# Patient Record
Sex: Female | Born: 1945 | Hispanic: Refuse to answer | Marital: Married
Health system: Midwestern US, Community
[De-identification: ages and names within clinical notes are randomized; demographics above are authoritative.]

## PROBLEM LIST (undated history)

## (undated) DIAGNOSIS — M48061 Spinal stenosis, lumbar region without neurogenic claudication: Secondary | ICD-10-CM

## (undated) DIAGNOSIS — I10 Essential (primary) hypertension: Secondary | ICD-10-CM

## (undated) DIAGNOSIS — M4316 Spondylolisthesis, lumbar region: Secondary | ICD-10-CM

## (undated) DIAGNOSIS — Z780 Asymptomatic menopausal state: Secondary | ICD-10-CM

## (undated) DIAGNOSIS — M48062 Spinal stenosis, lumbar region with neurogenic claudication: Secondary | ICD-10-CM

## (undated) DIAGNOSIS — Z1239 Encounter for other screening for malignant neoplasm of breast: Secondary | ICD-10-CM

## (undated) DIAGNOSIS — Z1231 Encounter for screening mammogram for malignant neoplasm of breast: Secondary | ICD-10-CM

## (undated) DIAGNOSIS — M25519 Pain in unspecified shoulder: Secondary | ICD-10-CM

## (undated) DIAGNOSIS — E782 Mixed hyperlipidemia: Secondary | ICD-10-CM

---

## 2013-12-01 ENCOUNTER — Encounter

## 2013-12-20 MED ORDER — SODIUM BICARBONATE 4 % IV
4 % | Freq: Once | INTRAVENOUS | Status: AC
Start: 2013-12-20 — End: 2013-12-20
  Administered 2013-12-20: 12:00:00 via SUBCUTANEOUS

## 2013-12-20 MED ORDER — METHYLPREDNISOLONE 40 MG/ML SUSP FOR INJECTION
40 mg/mL | Freq: Once | INTRAMUSCULAR | Status: AC
Start: 2013-12-20 — End: 2013-12-20
  Administered 2013-12-20: 12:00:00 via INTRAMUSCULAR

## 2013-12-20 MED ORDER — BUPIVACAINE (PF) 0.5 % (5 MG/ML) IJ SOLN
0.5 % (5 mg/mL) | Freq: Once | INTRAMUSCULAR | Status: DC
Start: 2013-12-20 — End: 2013-12-20

## 2013-12-20 MED ORDER — IOHEXOL 180 MG/ML SOLN
180 mg iodine/mL | Freq: Once | INTRATHECAL | Status: AC
Start: 2013-12-20 — End: 2013-12-20
  Administered 2013-12-20: 12:00:00 via INTRATHECAL

## 2013-12-20 MED ORDER — LIDOCAINE (PF) 10 MG/ML (1 %) IJ SOLN
10 mg/mL (1 %) | Freq: Once | INTRAMUSCULAR | Status: AC
Start: 2013-12-20 — End: 2013-12-20
  Administered 2013-12-20: 12:00:00 via SUBCUTANEOUS

## 2013-12-20 MED ADMIN — sodium chloride 0.9 % injection: @ 12:00:00 | NDC 00409488810

## 2013-12-20 MED FILL — SENSORCAINE-MPF 0.5 % (5 MG/ML) INJECTION SOLUTION: 0.5 % (5 mg/mL) | INTRAMUSCULAR | Qty: 30

## 2013-12-20 MED FILL — DEPO-MEDROL 40 MG/ML SUSPENSION FOR INJECTION: 40 mg/mL | INTRAMUSCULAR | Qty: 2

## 2013-12-20 MED FILL — SODIUM CHLORIDE 0.9 % INJECTION: INTRAMUSCULAR | Qty: 10

## 2013-12-20 MED FILL — OMNIPAQUE 180 MG IODINE/ML INTRATHECAL SOLUTION: 180 mg iodine/mL | INTRATHECAL | Qty: 20

## 2013-12-20 MED FILL — XYLOCAINE-MPF 10 MG/ML (1 %) INJECTION SOLUTION: 10 mg/mL (1 %) | INTRAMUSCULAR | Qty: 10

## 2013-12-20 MED FILL — NEUT 4 % INTRAVENOUS SOLUTION: 4 % | INTRAVENOUS | Qty: 1

## 2013-12-20 NOTE — Other (Signed)
Discharged via wheel chair to husband.  No complaints voiced.

## 2014-03-02 ENCOUNTER — Encounter

## 2014-03-08 MED ORDER — LIDOCAINE (PF) 10 MG/ML (1 %) IJ SOLN
10 mg/mL (1 %) | Freq: Once | INTRAMUSCULAR | Status: AC
Start: 2014-03-08 — End: 2014-03-08
  Administered 2014-03-08: 13:00:00 via SUBCUTANEOUS

## 2014-03-08 MED ORDER — SODIUM CHLORIDE 0.9 % INJECTION
Freq: Once | INTRAMUSCULAR | Status: AC
Start: 2014-03-08 — End: 2014-03-08
  Administered 2014-03-08: 13:00:00

## 2014-03-08 MED ORDER — SODIUM BICARBONATE 4 % IV
4 % | Freq: Once | INTRAVENOUS | Status: AC
Start: 2014-03-08 — End: 2014-03-08
  Administered 2014-03-08: 13:00:00 via SUBCUTANEOUS

## 2014-03-08 MED ORDER — METHYLPREDNISOLONE 40 MG/ML SUSP FOR INJECTION
40 mg/mL | Freq: Once | INTRAMUSCULAR | Status: AC
Start: 2014-03-08 — End: 2014-03-08
  Administered 2014-03-08: 13:00:00 via INTRAMUSCULAR

## 2014-03-08 MED ORDER — IOHEXOL 180 MG/ML SOLN
180 mg iodine/mL | Freq: Once | INTRATHECAL | Status: AC
Start: 2014-03-08 — End: 2014-03-08
  Administered 2014-03-08: 13:00:00 via INTRATHECAL

## 2014-03-08 MED FILL — XYLOCAINE-MPF 10 MG/ML (1 %) INJECTION SOLUTION: 10 mg/mL (1 %) | INTRAMUSCULAR | Qty: 10

## 2014-03-08 MED FILL — OMNIPAQUE 180 MG IODINE/ML INTRATHECAL SOLUTION: 180 mg iodine/mL | INTRATHECAL | Qty: 20

## 2014-03-08 MED FILL — SODIUM CHLORIDE 0.9 % INJECTION: INTRAMUSCULAR | Qty: 10

## 2014-03-08 MED FILL — DEPO-MEDROL 40 MG/ML SUSPENSION FOR INJECTION: 40 mg/mL | INTRAMUSCULAR | Qty: 2

## 2014-03-08 MED FILL — NEUT 4 % INTRAVENOUS SOLUTION: 4 % | INTRAVENOUS | Qty: 1

## 2014-03-08 NOTE — Other (Signed)
Disc harge instructions reviewed with patient,all questions answered. Copy to patient. To discharge area via wheelchair,home with husband.

## 2014-09-14 ENCOUNTER — Encounter

## 2014-09-16 ENCOUNTER — Inpatient Hospital Stay: Admit: 2014-09-16 | Discharge: 2014-09-16 | Payer: MEDICARE | Primary: Internal Medicine

## 2014-09-16 DIAGNOSIS — M8580 Other specified disorders of bone density and structure, unspecified site: Secondary | ICD-10-CM

## 2014-09-16 DIAGNOSIS — Z1231 Encounter for screening mammogram for malignant neoplasm of breast: Secondary | ICD-10-CM

## 2015-08-22 ENCOUNTER — Encounter

## 2015-09-19 ENCOUNTER — Inpatient Hospital Stay: Admit: 2015-09-19 | Discharge: 2015-09-19 | Payer: MEDICARE | Primary: Internal Medicine

## 2015-09-19 DIAGNOSIS — Z1231 Encounter for screening mammogram for malignant neoplasm of breast: Secondary | ICD-10-CM

## 2016-08-22 ENCOUNTER — Encounter

## 2016-08-29 ENCOUNTER — Ambulatory Visit: Payer: MEDICARE | Primary: Internal Medicine

## 2016-09-03 ENCOUNTER — Encounter

## 2016-09-04 ENCOUNTER — Inpatient Hospital Stay: Admit: 2016-09-04 | Discharge: 2016-09-04 | Payer: MEDICARE | Primary: Internal Medicine

## 2016-09-04 ENCOUNTER — Ambulatory Visit

## 2016-09-04 DIAGNOSIS — M48061 Spinal stenosis, lumbar region without neurogenic claudication: Secondary | ICD-10-CM

## 2016-09-20 ENCOUNTER — Inpatient Hospital Stay: Admit: 2016-09-20 | Discharge: 2016-09-20 | Payer: MEDICARE | Primary: Internal Medicine

## 2016-09-20 DIAGNOSIS — Z1239 Encounter for other screening for malignant neoplasm of breast: Secondary | ICD-10-CM

## 2016-10-02 ENCOUNTER — Encounter

## 2016-11-06 ENCOUNTER — Inpatient Hospital Stay: Admit: 2016-11-06 | Payer: MEDICARE | Attending: Neuroradiology | Primary: Internal Medicine

## 2016-11-06 DIAGNOSIS — M48061 Spinal stenosis, lumbar region without neurogenic claudication: Secondary | ICD-10-CM

## 2016-11-06 MED ORDER — IOHEXOL 180 MG/ML SOLN
180 mg iodine/mL | Freq: Once | INTRATHECAL | Status: AC
Start: 2016-11-06 — End: 2016-11-06
  Administered 2016-11-06: 14:00:00 via INTRATHECAL

## 2016-11-06 MED ORDER — LIDOCAINE (PF) 10 MG/ML (1 %) IJ SOLN
10 mg/mL (1 %) | Freq: Once | INTRAMUSCULAR | Status: AC
Start: 2016-11-06 — End: 2016-11-06
  Administered 2016-11-06: 14:00:00 via INTRAVENOUS

## 2016-11-06 MED ORDER — METHYLPREDNISOLONE 40 MG/ML SUSP FOR INJECTION
40 mg/mL | Freq: Once | INTRAMUSCULAR | Status: AC
Start: 2016-11-06 — End: 2016-11-06
  Administered 2016-11-06: 14:00:00 via INTRA_ARTICULAR

## 2016-11-06 MED FILL — OMNIPAQUE 180 MG IODINE/ML INTRATHECAL SOLUTION: 180 mg iodine/mL | INTRATHECAL | Qty: 20

## 2016-11-06 MED FILL — XYLOCAINE-MPF 10 MG/ML (1 %) INJECTION SOLUTION: 10 mg/mL (1 %) | INTRAMUSCULAR | Qty: 10

## 2016-11-06 MED FILL — DEPO-MEDROL 40 MG/ML SUSPENSION FOR INJECTION: 40 mg/mL | INTRAMUSCULAR | Qty: 2

## 2017-01-15 ENCOUNTER — Inpatient Hospital Stay: Admit: 2017-01-15 | Payer: MEDICARE | Primary: Internal Medicine

## 2017-01-15 ENCOUNTER — Encounter

## 2017-01-15 DIAGNOSIS — M25512 Pain in left shoulder: Secondary | ICD-10-CM

## 2017-03-04 ENCOUNTER — Encounter

## 2017-03-07 ENCOUNTER — Inpatient Hospital Stay: Admit: 2017-03-07 | Discharge: 2017-03-07 | Payer: MEDICARE | Primary: Internal Medicine

## 2017-03-07 DIAGNOSIS — Z780 Asymptomatic menopausal state: Secondary | ICD-10-CM

## 2017-04-17 ENCOUNTER — Encounter

## 2017-04-28 ENCOUNTER — Inpatient Hospital Stay
Admit: 2017-04-28 | Payer: MEDICARE | Attending: Student in an Organized Health Care Education/Training Program | Primary: Internal Medicine

## 2017-04-28 DIAGNOSIS — M48061 Spinal stenosis, lumbar region without neurogenic claudication: Secondary | ICD-10-CM

## 2017-04-28 MED ORDER — IOHEXOL 180 MG/ML SOLN
180 mg iodine/mL | Freq: Once | INTRATHECAL | Status: AC
Start: 2017-04-28 — End: 2017-04-28
  Administered 2017-04-28: 18:00:00 via INTRATHECAL

## 2017-04-28 MED ORDER — SODIUM CHLORIDE 0.9 % INJECTION
Freq: Once | INTRAMUSCULAR | Status: DC
Start: 2017-04-28 — End: 2017-04-28

## 2017-04-28 MED ORDER — LIDOCAINE (PF) 10 MG/ML (1 %) IJ SOLN
10 mg/mL (1 %) | Freq: Once | INTRAMUSCULAR | Status: AC
Start: 2017-04-28 — End: 2017-04-28
  Administered 2017-04-28: 18:00:00 via SUBCUTANEOUS

## 2017-04-28 MED ORDER — METHYLPREDNISOLONE 80 MG/ML SUSP FOR INJECTION
80 mg/mL | Freq: Once | INTRAMUSCULAR | Status: AC
Start: 2017-04-28 — End: 2017-04-28
  Administered 2017-04-28: 18:00:00 via INTRAMUSCULAR

## 2017-04-28 MED FILL — DEPO-MEDROL 80 MG/ML SUSPENSION FOR INJECTION: 80 mg/mL | INTRAMUSCULAR | Qty: 1

## 2017-04-28 MED FILL — OMNIPAQUE 180 MG IODINE/ML INTRATHECAL SOLUTION: 180 mg iodine/mL | INTRATHECAL | Qty: 20

## 2017-04-28 MED FILL — XYLOCAINE-MPF 10 MG/ML (1 %) INJECTION SOLUTION: 10 mg/mL (1 %) | INTRAMUSCULAR | Qty: 10

## 2017-04-28 MED FILL — SODIUM CHLORIDE 0.9 % INJECTION: INTRAMUSCULAR | Qty: 10

## 2017-04-28 NOTE — H&P (Signed)
Radiology History and Physical    Patient: Tracy Clements 71 y.o. female       Chief Complaint: No chief complaint on file.      History of Present Illness: Back pain radiating down the right leg.    History:    Past Medical History:   Diagnosis Date   ??? Arthritis     osteroarthritis   ??? Cancer (HCC)     ? skin cancer   ??? Chronic pain     low back pain   ??? GERD (gastroesophageal reflux disease)    ??? Hyperlipidemia    ??? Hypertension    ??? Menopause 2004     Family History   Problem Relation Age of Onset   ??? Heart Disease Mother    ??? Hypertension Father    ??? Heart Disease Father    ??? Stroke Father      Social History     Socioeconomic History   ??? Marital status: MARRIED     Spouse name: Not on file   ??? Number of children: Not on file   ??? Years of education: Not on file   ??? Highest education level: Not on file   Social Needs   ??? Financial resource strain: Not on file   ??? Food insecurity - worry: Not on file   ??? Food insecurity - inability: Not on file   ??? Transportation needs - medical: Not on file   ??? Transportation needs - non-medical: Not on file   Occupational History   ??? Not on file   Tobacco Use   ??? Smoking status: Never Smoker   ??? Smokeless tobacco: Never Used   Substance and Sexual Activity   ??? Alcohol use: Yes     Comment: very rarely   ??? Drug use: Not on file   ??? Sexual activity: Not on file   Other Topics Concern   ??? Not on file   Social History Narrative   ??? Not on file       Allergies:   Allergies   Allergen Reactions   ??? Betadine [Povidone-Iodine] Atopic Dermatitis     Whelps and rash   ??? Dexamethasone Other (comments)     Extreme muscle spasm down right leg-taken orally -dose pack   ??? Erythromycin Palpitations   ??? Hydrocodone Other (comments)     "Stomach and bowel problems."   ??? Mold Extracts Sneezing   ??? Other Plant, Animal, Environmental Contact Dermatitis     Walnut trees causes itching.   ??? Sulindac Swelling     Itchy hands and swelling   ??? Tape [Adhesive] Contact Dermatitis     Whelps and rash        Current Medications:  Current Facility-Administered Medications   Medication Dose Route Frequency   ??? sodium chloride 0.9% injection 10 mL  10 mL InterCATHeter ONCE        Physical Exam:  Blood pressure (!) 174/94, pulse 70, temperature 97.8 ??F (36.6 ??C), resp. rate 20, SpO2 93 %.  GENERAL: alert, cooperative, no distress, appears stated age  LUNG: clear to auscultation bilaterally  HEART: regular rate and rhythm  ABD: Non tender, non distended.      Alerts:      Laboratory:    No results for input(s): HGB, HCT, WBC, PLT, INR, BUN, CREA, K, CRCLT, HGBEXT, HCTEXT, PLTEXT in the last 72 hours.    No lab exists for component: PTT, PT, INREXT      Plan  of Care/Planned Procedure:  Risks, benefits, and alternatives reviewed with patient and she agrees to proceed with the procedure.       Aaron EdelmanJawad S Keishawn Darsey, MD

## 2017-07-30 ENCOUNTER — Encounter

## 2017-08-07 ENCOUNTER — Inpatient Hospital Stay: Admit: 2017-08-07 | Payer: MEDICARE | Primary: Internal Medicine

## 2017-08-07 ENCOUNTER — Ambulatory Visit

## 2017-08-07 DIAGNOSIS — M48061 Spinal stenosis, lumbar region without neurogenic claudication: Secondary | ICD-10-CM

## 2017-08-27 ENCOUNTER — Encounter

## 2017-09-01 ENCOUNTER — Encounter

## 2017-09-04 ENCOUNTER — Encounter

## 2017-09-09 ENCOUNTER — Encounter

## 2017-09-22 ENCOUNTER — Inpatient Hospital Stay: Admit: 2017-09-22 | Payer: MEDICARE | Primary: Internal Medicine

## 2017-09-22 DIAGNOSIS — Z1231 Encounter for screening mammogram for malignant neoplasm of breast: Secondary | ICD-10-CM

## 2017-09-29 ENCOUNTER — Inpatient Hospital Stay
Admit: 2017-09-29 | Payer: MEDICARE | Attending: Student in an Organized Health Care Education/Training Program | Primary: Internal Medicine

## 2017-09-29 DIAGNOSIS — M48062 Spinal stenosis, lumbar region with neurogenic claudication: Secondary | ICD-10-CM

## 2017-09-29 MED ORDER — DEXAMETHASONE SODIUM PHOSPHATE (PF) 10 MG/ML INJECTION
10 mg/mL | Freq: Once | INTRAMUSCULAR | Status: AC
Start: 2017-09-29 — End: 2017-09-29
  Administered 2017-09-29: 12:00:00 via INTRAVENOUS

## 2017-09-29 MED ORDER — IOHEXOL 180 MG/ML SOLN
180 mg iodine/mL | Freq: Once | INTRATHECAL | Status: AC
Start: 2017-09-29 — End: 2017-09-29
  Administered 2017-09-29: 12:00:00 via INTRATHECAL

## 2017-09-29 MED ORDER — LIDOCAINE (PF) 10 MG/ML (1 %) IJ SOLN
10 mg/mL (1 %) | Freq: Once | INTRAMUSCULAR | Status: AC
Start: 2017-09-29 — End: 2017-09-29
  Administered 2017-09-29: 12:00:00 via SUBCUTANEOUS

## 2017-09-29 MED FILL — DEXAMETHASONE SODIUM PHOSPHATE (PF) 10 MG/ML INJECTION: 10 mg/mL | INTRAMUSCULAR | Qty: 1

## 2017-09-29 MED FILL — LIDOCAINE (PF) 10 MG/ML (1 %) IJ SOLN: 10 mg/mL (1 %) | INTRAMUSCULAR | Qty: 10

## 2017-09-29 MED FILL — OMNIPAQUE 180 MG IODINE/ML INTRATHECAL SOLUTION: 180 mg iodine/mL | INTRATHECAL | Qty: 20

## 2017-09-29 NOTE — H&P (Signed)
Radiology History and Physical    Patient: Tracy Clements 72 y.o. female       Chief Complaint: No chief complaint on file.      History of Present Illness: Back pain    History:    Past Medical History:   Diagnosis Date   ??? Arthritis     osteroarthritis   ??? Cancer (HCC)     ? skin cancer   ??? Chronic pain     low back pain   ??? GERD (gastroesophageal reflux disease)    ??? Hyperlipidemia    ??? Hypertension    ??? Menopause 2004     Family History   Problem Relation Age of Onset   ??? Heart Disease Mother    ??? Hypertension Father    ??? Heart Disease Father    ??? Stroke Father      Social History     Socioeconomic History   ??? Marital status: MARRIED     Spouse name: Not on file   ??? Number of children: Not on file   ??? Years of education: Not on file   ??? Highest education level: Not on file   Occupational History   ??? Not on file   Social Needs   ??? Financial resource strain: Not on file   ??? Food insecurity:     Worry: Not on file     Inability: Not on file   ??? Transportation needs:     Medical: Not on file     Non-medical: Not on file   Tobacco Use   ??? Smoking status: Never Smoker   ??? Smokeless tobacco: Never Used   Substance and Sexual Activity   ??? Alcohol use: Yes     Comment: very rarely   ??? Drug use: Not on file   ??? Sexual activity: Not on file   Lifestyle   ??? Physical activity:     Days per week: Not on file     Minutes per session: Not on file   ??? Stress: Not on file   Relationships   ??? Social connections:     Talks on phone: Not on file     Gets together: Not on file     Attends religious service: Not on file     Active member of club or organization: Not on file     Attends meetings of clubs or organizations: Not on file     Relationship status: Not on file   ??? Intimate partner violence:     Fear of current or ex partner: Not on file     Emotionally abused: Not on file     Physically abused: Not on file     Forced sexual activity: Not on file   Other Topics Concern   ??? Not on file   Social History Narrative    ??? Not on file       Allergies:   Allergies   Allergen Reactions   ??? Betadine [Povidone-Iodine] Atopic Dermatitis     Whelps and rash   ??? Dexamethasone Other (comments)     Extreme muscle spasm down right leg-taken orally -dose pack   ??? Erythromycin Palpitations   ??? Hydrocodone Other (comments)     "Stomach and bowel problems."   ??? Mold Extracts Sneezing   ??? Other Plant, Animal, Environmental Contact Dermatitis     Walnut trees causes itching.   ??? Sulindac Swelling     Itchy hands and swelling   ???  Tape [Adhesive] Contact Dermatitis     Whelps and rash       Current Medications:  No current facility-administered medications for this encounter.         Physical Exam:  Blood pressure 161/72, pulse 63, temperature 98.3 ??F (36.8 ??C), resp. rate 20, weight 68.9 kg (152 lb), SpO2 99 %, not currently breastfeeding.  GENERAL: alert, cooperative, no distress, appears stated age  LUNG: clear to auscultation bilaterally  HEART: regular rate and rhythm  ABD: Non tender, non distended.      Alerts:      Laboratory:    No results for input(s): HGB, HCT, WBC, PLT, INR, BUN, CREA, K, CRCLT, HGBEXT, HCTEXT, PLTEXT in the last 72 hours.    No lab exists for component: PTT, PT, INREXT      Plan of Care/Planned Procedure:  Risks, benefits, and alternatives reviewed with patient and she agrees to proceed with the procedure.       Aaron EdelmanJawad S Jaliah Foody, MD

## 2017-09-29 NOTE — Progress Notes (Signed)
Pt discharged via wheelchair escorted by husband, discharge instructions given and both verbalize understanding

## 2017-12-10 ENCOUNTER — Ambulatory Visit: Admit: 2017-12-10 | Discharge: 2017-12-10 | Payer: MEDICARE | Attending: Internal Medicine | Primary: Internal Medicine

## 2017-12-10 DIAGNOSIS — I10 Essential (primary) hypertension: Secondary | ICD-10-CM

## 2017-12-10 MED ORDER — LOSARTAN-HYDROCHLOROTHIAZIDE 50 MG-12.5 MG TAB
ORAL_TABLET | Freq: Every day | ORAL | 0 refills | Status: DC
Start: 2017-12-10 — End: 2017-12-29

## 2017-12-10 NOTE — Progress Notes (Signed)
 Visit Vitals  BP (!) 171/106 (BP 1 Location: Left arm, BP Patient Position: Sitting)   Pulse 63   Temp 98.2 F (36.8 C) (Oral)   Resp 16   Ht 5' 3 (1.6 m)   Wt 155 lb 6.4 oz (70.5 kg)   SpO2 100%   BMI 27.53 kg/m           Chief Complaint   Patient presents with   . Establish Care     Patient needs to establish PCP                 Patient is scheduled for [facility] in July.           ====Honoring Choices Invitation====    Patient was invited to [employer] [facility]  on this date and given the information folder for review.    Recommended appointment with Honoring Choices facilitator for ACP conversation regarding advance directives.    [x]  Yes  []  No  Referral sent to [facility] Choices team member or Coordinator for follow-up    []  Yes  [x]  No  Patient scheduled an appointment.       Site of Referral: [facility]              1. Have you been to the ER, urgent care clinic since your last visit?  Hospitalized since your last visit? Denies     2. Have you seen or consulted any other health care providers outside of the [facility] since your last visit?  Include any pap smears or colon screening. Patient is new to Practice.

## 2017-12-10 NOTE — Progress Notes (Signed)
Written by San Jetty, [place], as dictated by Dr. Vernia Buff, MD.    HISTORY OF PRESENT ILLNESS  Tracy Clements is a 72 y.o. female.  HPI  The patient comes in today to establish care. She brought her recent MRI and bone density results as well as her medication history.    BP is high today at 171/106, 146/90 on repeat. Patient has been check her BP at home and notes that it has averaged mid 140s systolic and mid 16X diastolic for years. Denies headaches and leg swelling. Compliant on HCTZ 25 mg, but has not noticed improved in her readings since her dosage was increased from 12.5 mg. She has not tried any other HTN medications. About 1-1.5 years ago she had a stress test at her request because she was having abnormal SOB. Stress test was unremarkable.    She has lumbar stenosis and herniated discs at L3, L4, L5, and S1 for which she sees Dr. Heather Roberts (neurosurgery). Pt has been receiving injections for 15-20 years, but notes that the injections started to not work as well last summer. Her pain radiates down her legs an into her feet. She has been seeing PT, noting she works with a PT who is very familiar with her pain. Patient has been careful with her activities and is considering switching to Dr. Sharol Harness (orthopedic surgery).    Dexa in 03/2017 found: This patient is osteopenic using the World Health Organization criteria. As compared to the prior study, there has been a significant 6.1% increase in the lumbar spine. 10 year probability of major osteoporotic fracture:  11%. 10 year probability of hip fracture:  2.1%. She is taking OTC calcium as well as vitamin D3 and K2. The pt speed walks 3 miles 2-3 times per day and aims for 9,000-10,000 steps daily.    Compliant on simvastatin 20 mg and fish oil daily. Denies side effects from simvastatin. She is not fasting for labs today.    OTC probiotics relieve her constipation. She has FHX of chronic constipation.    She takes Nexium as needed for  heartburn, noting she never took it regularly.    Pt takes mobic as needed.    She does not know if she as been checked for hepatitis C.    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    Current Outpatient Medications on File Prior to Visit   Medication Sig Dispense Refill   ??? multivitamin (ONE A DAY) tablet Take 1 Tab by mouth daily.     ??? fish oil-omega-3 fatty acids (FISH OIL) 340-1,000 mg capsule Take 1 Cap by mouth daily.     ??? co-enzyme Q-10 (CO Q-10) 100 mg capsule Take 100 mg by mouth daily.     ??? pediatric multivitamin no.42 (CHILD'S GUMMY VITAMIN-MINERAL PO) Take  by mouth.     ??? Bacillus coagulans (DIGESTIVE ADVANTAGE PO) Take  by mouth.     ??? potassium chloride SA (MICRO-K) 10 mEq capsule Take 10 mEq by mouth daily.     ??? simvastatin (ZOCOR) 20 mg tablet Take 20 mg by mouth nightly.     ??? meloxicam (MOBIC) 15 mg tablet Take 15 mg by mouth as needed for Pain.     ??? esomeprazole (NEXIUM) 40 mg capsule Take 40 mg by mouth daily.       No current facility-administered medications on file prior to visit.        Allergies   Allergen Reactions   ???  Betadine [Povidone-Iodine] Atopic Dermatitis     Whelps and rash   ??? Dexamethasone Other (comments)     Extreme muscle spasm down right leg-taken orally -dose pack   ??? Erythromycin Palpitations   ??? Hydrocodone Other (comments)     "Stomach and bowel problems."   ??? Mold Extracts Sneezing   ??? Other Plant, Animal, Environmental Contact Dermatitis     Walnut trees causes itching.   ??? Sulindac Swelling     Itchy hands and swelling   ??? Tape [Adhesive] Contact Dermatitis     Whelps and rash       Past Medical History:   Diagnosis Date   ??? Arthritis     osteroarthritis   ??? Cancer (HCC)     ? skin cancer   ??? Chronic pain     low back pain   ??? GERD (gastroesophageal reflux disease)    ??? Hyperlipidemia    ??? Hypertension    ??? Menopause 2004       Past Surgical History:   Procedure Laterality Date   ??? BREAST SURGERY PROCEDURE UNLISTED  2007    breast reduction   ??? HX BREAST  BIOPSY Left 2000    benign   ??? HX BREAST REDUCTION Bilateral    ??? HX GYN  1980    left ovarian cyst removed   ??? HX HEENT  07/2013    face lift   ??? HX HEENT      tonsiloectomy & adnoidectomy   ??? HX ORTHOPAEDIC  2005    right rotator cuff repair   ??? HX OTHER SURGICAL  1987    cyst removed left arm   ??? IR INJ FORAMIN EPID LUMB ANES/STER SNGL  09/29/2017       Family History   Problem Relation Age of Onset   ??? Heart Disease Mother    ??? Heart Disease Father    ??? Stroke Father    ??? Hypertension Father        Social History     Socioeconomic History   ??? Marital status: MARRIED     Spouse name: Not on file   ??? Number of children: Not on file   ??? Years of education: Not on file   ??? Highest education level: Not on file   Occupational History   ??? Not on file   Social Needs   ??? Financial resource strain: Not on file   ??? Food insecurity:     Worry: Not on file     Inability: Not on file   ??? Transportation needs:     Medical: Not on file     Non-medical: Not on file   Tobacco Use   ??? Smoking status: Never Smoker   ??? Smokeless tobacco: Never Used   Substance and Sexual Activity   ??? Alcohol use: Yes     Comment: very rarely   ??? Drug use: Never   ??? Sexual activity: Not Currently   Lifestyle   ??? Physical activity:     Days per week: Not on file     Minutes per session: Not on file   ??? Stress: Not on file   Relationships   ??? Social connections:     Talks on phone: Not on file     Gets together: Not on file     Attends religious service: Not on file     Active member of club or organization: Not on file     Attends meetings  of clubs or organizations: Not on file     Relationship status: Not on file   ??? Intimate partner violence:     Fear of current or ex partner: Not on file     Emotionally abused: Not on file     Physically abused: Not on file     Forced sexual activity: Not on file   Other Topics Concern   ??? Not on file   Social History Narrative   ??? Not on file       Review of Systems   Constitutional: Negative for malaise/fatigue.    HENT: Negative for congestion.    Eyes: Negative for blurred vision and pain.   Respiratory: Negative for cough and shortness of breath.    Cardiovascular: Negative for chest pain, palpitations and leg swelling.   Gastrointestinal: Negative for abdominal pain and heartburn.   Genitourinary: Negative for frequency and urgency.   Musculoskeletal: Positive for back pain. Negative for joint pain and myalgias.   Neurological: Negative for dizziness, tingling, sensory change, weakness and headaches.   Psychiatric/Behavioral: Negative for depression, memory loss and substance abuse.     Visit Vitals  BP 146/90 (BP 1 Location: Left arm, BP Patient Position: Sitting) Comment: Pt. states she has white coat syndrome /Chronic pain   Pulse 63   Temp 98.2 ??F (36.8 ??C) (Oral)   Resp 16   Ht 5\' 3"  (1.6 m)   Wt 155 lb 6.4 oz (70.5 kg)   SpO2 100%   BMI 27.53 kg/m??       Physical Exam   Constitutional: She is oriented to person, place, and time. She appears well-developed and well-nourished. No distress.   HENT:   Right Ear: External ear normal.   Left Ear: External ear normal.   Eyes: Conjunctivae and EOM are normal. Right eye exhibits no discharge. Left eye exhibits no discharge.   Neck: Normal range of motion. Neck supple.   Cardiovascular: Normal rate, regular rhythm and normal heart sounds.   Pulmonary/Chest: Effort normal and breath sounds normal. She has no wheezes.   Abdominal: Soft. Bowel sounds are normal. There is no tenderness.   Lymphadenopathy:     She has no cervical adenopathy.   Neurological: She is alert and oriented to person, place, and time.   Skin: She is not diaphoretic.   Psychiatric: She has a normal mood and affect. Her behavior is normal.   Nursing note and vitals reviewed.      ASSESSMENT and PLAN    ICD-10-CM ICD-9-CM    1. Essential hypertension I10 401.9 losartan-hydroCHLOROthiazide (HYZAAR) 50-12.5 mg per tablet sent to pharmacy.      METABOLIC PANEL, COMPREHENSIVE      CBC W/O DIFF      TSH 3RD  GENERATION    Losartan-HCTZ 50-12.5 mg prescribed. Potential side effects were discussed. She should take 1 tab PO daily. Instructed patient to keep a BP log and bring it to next appointment so message me via MyChart. CMP, CBC, and TSH ordered.   2. Spinal stenosis of lumbar region with neurogenic claudication M48.062 724.03 Followed by neurosurgery.   3. Lumbar herniated disc M51.26 722.10 Followed by neurosurgery.   4. Osteopenia of left forearm M85.832 733.90 Patient is taking calcium, vitamin D3, and vitamin K2. She does regular weight resistance exercise.   5. Mixed hyperlipidemia E78.2 272.2 LIPID PANEL    Lipid panel ordered. Pt will return during lab hours to have basic fasting labs drawn.   6. Need for  hepatitis C screening test Z11.59 V73.89 HEPATITIS C AB    Hepatitis C screening ordered.        This plan was reviewed with the patient and patient agrees. All questions were answered.    This scribe documentation was reviewed by me and accurately reflects the examination and decisions made by me.    This note will not be viewable in MyChart.

## 2017-12-10 NOTE — Progress Notes (Signed)
Written by [place] Jetty, [place], as dictated by Dr. Vernia Buff, MD.    HISTORY OF PRESENT ILLNESS  Tracy Clements is a 72 y.o. female.  HPI  The patient comes in today to establish care. She brought her recent MRI and bone density results as well as her medication history.    BP is high today at 171/106, 146/90 on repeat. Patient has been check her BP at home and notes that it has averaged mid 140s systolic and mid 16X diastolic for years. Denies headaches and leg swelling. Compliant on HCTZ 25 mg, but has not noticed improved in her readings since her dosage was increased from 12.5 mg. She has not tried any other HTN medications. About 1-1.5 years ago she had a stress test at her request because she was having abnormal SOB. Stress test was unremarkable.    She has lumbar stenosis and herniated discs at L3, L4, L5, and S1 for which she sees Dr. Heather Roberts (neurosurgery). Pt has been receiving injections for 15-20 years, but notes that the injections started to not work as well last summer. Her pain radiates down her legs an into her feet. She has been seeing PT, noting she works with a PT who is very familiar with her pain. Patient has been careful with her activities and is considering switching to Dr. Sharol Harness (orthopedic surgery).    Dexa in 03/2017 found: This patient is osteopenic using the World Health Organization criteria. As compared to the prior study, there has been a significant 6.1% increase in the lumbar spine. 10 year probability of major osteoporotic fracture:  11%. 10 year probability of hip fracture:  2.1%. She is taking OTC calcium as well as vitamin D3 and K2. The pt speed walks 3 miles 2-3 times per day and aims for 9,000-10,000 steps daily.    Compliant on simvastatin 20 mg and fish oil daily. Denies side effects from simvastatin. She is not fasting for labs today.    OTC probiotics relieve her constipation. She has FHX of chronic constipation.     She takes Nexium as needed for heartburn, noting she never took it regularly.    Pt takes mobic as needed.    She does not know if she as been checked for hepatitis C.    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    Current Outpatient Medications on File Prior to Visit   Medication Sig Dispense Refill   ??? multivitamin (ONE A DAY) tablet Take 1 Tab by mouth daily.     ??? fish oil-omega-3 fatty acids (FISH OIL) 340-1,000 mg capsule Take 1 Cap by mouth daily.     ??? co-enzyme Q-10 (CO Q-10) 100 mg capsule Take 100 mg by mouth daily.     ??? pediatric multivitamin no.42 (CHILD'S GUMMY VITAMIN-MINERAL PO) Take  by mouth.     ??? Bacillus coagulans (DIGESTIVE ADVANTAGE PO) Take  by mouth.     ??? potassium chloride SA (MICRO-K) 10 mEq capsule Take 10 mEq by mouth daily.     ??? simvastatin (ZOCOR) 20 mg tablet Take 20 mg by mouth nightly.     ??? meloxicam (MOBIC) 15 mg tablet Take 15 mg by mouth as needed for Pain.     ??? esomeprazole (NEXIUM) 40 mg capsule Take 40 mg by mouth daily.       No current facility-administered medications on file prior to visit.        Allergies   Allergen Reactions   ???  Betadine [Povidone-Iodine] Atopic Dermatitis     Whelps and rash   ??? Dexamethasone Other (comments)     Extreme muscle spasm down right leg-taken orally -dose pack   ??? Erythromycin Palpitations   ??? Hydrocodone Other (comments)     "Stomach and bowel problems."   ??? Mold Extracts Sneezing   ??? Other Plant, Animal, Environmental Contact Dermatitis     Walnut trees causes itching.   ??? Sulindac Swelling     Itchy hands and swelling   ??? Tape [Adhesive] Contact Dermatitis     Whelps and rash       Past Medical History:   Diagnosis Date   ??? Arthritis     osteroarthritis   ??? Cancer (HCC)     ? skin cancer   ??? Chronic pain     low back pain   ??? GERD (gastroesophageal reflux disease)    ??? Hyperlipidemia    ??? Hypertension    ??? Menopause 2004       Past Surgical History:   Procedure Laterality Date   ??? BREAST SURGERY PROCEDURE UNLISTED  2007     breast reduction   ??? HX BREAST BIOPSY Left 2000    benign   ??? HX BREAST REDUCTION Bilateral    ??? HX GYN  1980    left ovarian cyst removed   ??? HX HEENT  07/2013    face lift   ??? HX HEENT      tonsiloectomy & adnoidectomy   ??? HX ORTHOPAEDIC  2005    right rotator cuff repair   ??? HX OTHER SURGICAL  1987    cyst removed left arm   ??? IR INJ FORAMIN EPID LUMB ANES/STER SNGL  09/29/2017       Family History   Problem Relation Age of Onset   ??? Heart Disease Mother    ??? Heart Disease Father    ??? Stroke Father    ??? Hypertension Father        Social History     Socioeconomic History   ??? Marital status: MARRIED     Spouse name: Not on file   ??? Number of children: Not on file   ??? Years of education: Not on file   ??? Highest education level: Not on file   Occupational History   ??? Not on file   Social Needs   ??? Financial resource strain: Not on file   ??? Food insecurity:     Worry: Not on file     Inability: Not on file   ??? Transportation needs:     Medical: Not on file     Non-medical: Not on file   Tobacco Use   ??? Smoking status: Never Smoker   ??? Smokeless tobacco: Never Used   Substance and Sexual Activity   ??? Alcohol use: Yes     Comment: very rarely   ??? Drug use: Never   ??? Sexual activity: Not Currently   Lifestyle   ??? Physical activity:     Days per week: Not on file     Minutes per session: Not on file   ??? Stress: Not on file   Relationships   ??? Social connections:     Talks on phone: Not on file     Gets together: Not on file     Attends religious service: Not on file     Active member of club or organization: Not on file     Attends meetings  of clubs or organizations: Not on file     Relationship status: Not on file   ??? Intimate partner violence:     Fear of current or ex partner: Not on file     Emotionally abused: Not on file     Physically abused: Not on file     Forced sexual activity: Not on file   Other Topics Concern   ??? Not on file   Social History Narrative   ??? Not on file       Review of Systems    Constitutional: Negative for malaise/fatigue.   HENT: Negative for congestion.    Eyes: Negative for blurred vision and pain.   Respiratory: Negative for cough and shortness of breath.    Cardiovascular: Negative for chest pain, palpitations and leg swelling.   Gastrointestinal: Negative for abdominal pain and heartburn.   Genitourinary: Negative for frequency and urgency.   Musculoskeletal: Positive for back pain. Negative for joint pain and myalgias.   Neurological: Negative for dizziness, tingling, sensory change, weakness and headaches.   Psychiatric/Behavioral: Negative for depression, memory loss and substance abuse.     Visit Vitals  BP 146/90 (BP 1 Location: Left arm, BP Patient Position: Sitting) Comment: Pt. states she has white coat syndrome /Chronic pain   Pulse 63   Temp 98.2 ??F (36.8 ??C) (Oral)   Resp 16   Ht 5\' 3"  (1.6 m)   Wt 155 lb 6.4 oz (70.5 kg)   SpO2 100%   BMI 27.53 kg/m??       Physical Exam   Constitutional: She is oriented to person, place, and time. She appears well-developed and well-nourished. No distress.   HENT:   Right Ear: External ear normal.   Left Ear: External ear normal.   Eyes: Conjunctivae and EOM are normal. Right eye exhibits no discharge. Left eye exhibits no discharge.   Neck: Normal range of motion. Neck supple.   Cardiovascular: Normal rate, regular rhythm and normal heart sounds.   Pulmonary/Chest: Effort normal and breath sounds normal. She has no wheezes.   Abdominal: Soft. Bowel sounds are normal. There is no tenderness.   Lymphadenopathy:     She has no cervical adenopathy.   Neurological: She is alert and oriented to person, place, and time.   Skin: She is not diaphoretic.   Psychiatric: She has a normal mood and affect. Her behavior is normal.   Nursing note and vitals reviewed.      ASSESSMENT and PLAN    ICD-10-CM ICD-9-CM    1. Essential hypertension I10 401.9 losartan-hydroCHLOROthiazide (HYZAAR) 50-12.5 mg per tablet sent to pharmacy.       METABOLIC PANEL, COMPREHENSIVE      CBC W/O DIFF      TSH 3RD GENERATION    Losartan-HCTZ 50-12.5 mg prescribed. Potential side effects were discussed. She should take 1 tab PO daily. Instructed patient to keep a BP log and bring it to next appointment so message me via MyChart. CMP, CBC, and TSH ordered.   2. Spinal stenosis of lumbar region with neurogenic claudication M48.062 724.03 Followed by neurosurgery.   3. Lumbar herniated disc M51.26 722.10 Followed by neurosurgery.   4. Osteopenia of left forearm M85.832 733.90 Patient is taking calcium, vitamin D3, and vitamin K2. She does regular weight resistance exercise.   5. Mixed hyperlipidemia E78.2 272.2 LIPID PANEL    Lipid panel ordered. Pt will return during lab hours to have basic fasting labs drawn.   6. Need for  hepatitis C screening test Z11.59 V73.89 HEPATITIS C AB    Hepatitis C screening ordered.        This plan was reviewed with the patient and patient agrees. All questions were answered.    This scribe documentation was reviewed by me and accurately reflects the examination and decisions made by me.    This note will not be viewable in MyChart.

## 2017-12-10 NOTE — Progress Notes (Signed)
Visit Vitals  BP (!) 171/106 (BP 1 Location: Left arm, BP Patient Position: Sitting)   Pulse 63   Temp 98.2 ??F (36.8 ??C) (Oral)   Resp 16   Ht 5' 3" (1.6 m)   Wt 155 lb 6.4 oz (70.5 kg)   SpO2 100%   BMI 27.53 kg/m??           Chief Complaint   Patient presents with   ??? Establish Care     Patient needs to establish PCP                 Patient is scheduled for [facility] in July.           ====Honoring Choices Invitation====    Patient was invited to Honoring Choices [place] on this date and given the information folder for review.    Recommended appointment with Honoring Choices facilitator for ACP conversation regarding advance directives.    [x] Yes  [] No  Referral sent to Honoring Choices team member or Coordinator for follow-up    [] Yes  [x] No  Patient scheduled an appointment.       Site of Referral: [facility]              1. Have you been to the ER, urgent care clinic since your last visit?  Hospitalized since your last visit? Denies     2. Have you seen or consulted any other health care providers outside of the  Health System since your last visit?  Include any pap smears or colon screening. Patient is new to Practice.

## 2017-12-11 ENCOUNTER — Encounter

## 2017-12-11 NOTE — Progress Notes (Signed)
Tracy Clements, your cholesterol came back little elevated. How is your new medication working? Will discuss about zocor dose on your next visit.

## 2017-12-11 NOTE — Progress Notes (Signed)
Tracy Clements, your cholesterol came back little elevated. How is your new medication working? Will discuss about zocor dose on your next visit.

## 2017-12-12 LAB — METABOLIC PANEL, COMPREHENSIVE
A-G Ratio: 1.9 (ref 1.2–2.2)
ALT (SGPT): 31 IU/L (ref 0–32)
AST (SGOT): 35 IU/L (ref 0–40)
Albumin: 4.3 g/dL (ref 3.5–4.8)
Alk. phosphatase: 102 IU/L (ref 39–117)
BUN/Creatinine ratio: 17 (ref 12–28)
BUN: 15 mg/dL (ref 8–27)
Bilirubin, total: 0.3 mg/dL (ref 0.0–1.2)
CO2: 27 mmol/L (ref 20–29)
Calcium: 9.3 mg/dL (ref 8.7–10.3)
Chloride: 100 mmol/L (ref 96–106)
Creatinine: 0.88 mg/dL (ref 0.57–1.00)
GFR est AA: 76 mL/min/{1.73_m2} (ref >59)
GFR est non-AA: 66 mL/min/{1.73_m2} (ref >59)
GLOBULIN, TOTAL: 2.3 g/dL (ref 1.5–4.5)
Glucose: 98 mg/dL (ref 65–99)
Potassium: 3.6 mmol/L (ref 3.5–5.2)
Protein, total: 6.6 g/dL (ref 6.0–8.5)
Sodium: 142 mmol/L (ref 134–144)

## 2017-12-12 LAB — CBC W/O DIFF
HCT: 38.4 % (ref 34.0–46.6)
HGB: 13 g/dL (ref 11.1–15.9)
MCH: 30.7 pg (ref 26.6–33.0)
MCHC: 33.9 g/dL (ref 31.5–35.7)
MCV: 91 fL (ref 79–97)
PLATELET: 245 10*3/uL (ref 150–450)
RBC: 4.23 x10E6/uL (ref 3.77–5.28)
RDW: 14.4 % (ref 12.3–15.4)
WBC: 5.3 10*3/uL (ref 3.4–10.8)

## 2017-12-12 LAB — LIPID PANEL
Cholesterol, Total: 223 mg/dL — ABNORMAL HIGH (ref 100–199)
Cholesterol, total: 223 mg/dL — ABNORMAL HIGH (ref 100–199)
HDL Cholesterol: 95 mg/dL (ref >39)
HDL: 95 mg/dL (ref >39)
LDL Calculated: 116 mg/dL — ABNORMAL HIGH (ref 0–99)
LDL, calculated: 116 mg/dL — ABNORMAL HIGH (ref 0–99)
Triglyceride: 59 mg/dL (ref 0–149)
Triglycerides: 59 mg/dL (ref 0–149)
VLDL Cholesterol Calculated: 12 mg/dL (ref 5–40)
VLDL, calculated: 12 mg/dL (ref 5–40)

## 2017-12-12 LAB — HEPATITIS C AB: Hep C Virus Ab: <0.1 s/co ratio (ref 0.0–0.9)

## 2017-12-12 LAB — TSH 3RD GENERATION
TSH: 3.53 u[IU]/mL (ref 0.450–4.500)
TSH: 3.53 u[IU]/mL (ref 0.450–4.500)

## 2017-12-12 LAB — CBC
Hematocrit: 38.4 % (ref 34.0–46.6)
Hemoglobin: 13 g/dL (ref 11.1–15.9)
MCH: 30.7 pg (ref 26.6–33.0)
MCHC: 33.9 g/dL (ref 31.5–35.7)
MCV: 91 fL (ref 79–97)
Platelets: 245 10*3/uL (ref 150–450)
RBC: 4.23 x10E6/uL (ref 3.77–5.28)
RDW: 14.4 % (ref 12.3–15.4)
WBC: 5.3 10*3/uL (ref 3.4–10.8)

## 2017-12-12 LAB — COMPREHENSIVE METABOLIC PANEL
ALT: 31 IU/L (ref 0–32)
AST: 35 IU/L (ref 0–40)
Albumin/Globulin Ratio: 1.9 NA (ref 1.2–2.2)
Albumin: 4.3 g/dL (ref 3.5–4.8)
Alkaline Phosphatase: 102 IU/L (ref 39–117)
BUN: 15 mg/dL (ref 8–27)
Bun/Cre Ratio: 17 NA (ref 12–28)
CO2: 27 mmol/L (ref 20–29)
Calcium: 9.3 mg/dL (ref 8.7–10.3)
Chloride: 100 mmol/L (ref 96–106)
Creatinine: 0.88 mg/dL (ref 0.57–1.00)
EGFR IF NonAfrican American: 66 mL/min/{1.73_m2} (ref >59)
GFR African American: 76 mL/min/{1.73_m2} (ref >59)
Globulin, Total: 2.3 g/dL (ref 1.5–4.5)
Glucose: 98 mg/dL (ref 65–99)
Potassium: 3.6 mmol/L (ref 3.5–5.2)
Sodium: 142 mmol/L (ref 134–144)
Total Bilirubin: 0.3 mg/dL (ref 0.0–1.2)
Total Protein: 6.6 g/dL (ref 6.0–8.5)

## 2017-12-12 LAB — HEPATITIS C ANTIBODY: HCV Ab: <0.1 s/co ratio (ref 0.0–0.9)

## 2017-12-29 ENCOUNTER — Ambulatory Visit: Admit: 2017-12-29 | Discharge: 2017-12-29 | Payer: MEDICARE | Attending: Internal Medicine | Primary: Internal Medicine

## 2017-12-29 ENCOUNTER — Ambulatory Visit: Attending: Internal Medicine | Primary: Internal Medicine

## 2017-12-29 DIAGNOSIS — Z0000 Encounter for general adult medical examination without abnormal findings: Secondary | ICD-10-CM

## 2017-12-29 MED ORDER — DIPHTH,PERTUS(ACEL)TETANUS VAC(PF) 2 LF-(5-3-5MCG)-5 LF/0.5 ML IM SUSP
2 Lf-(.5-5-3-5 mcg)-5Lf/0.5 mL | Freq: Once | INTRAMUSCULAR | 0 refills | Status: AC
Start: 2017-12-29 — End: 2017-12-29

## 2017-12-29 MED ORDER — LOSARTAN-HYDROCHLOROTHIAZIDE 50 MG-12.5 MG TAB
ORAL_TABLET | Freq: Every day | ORAL | 0 refills | Status: DC
Start: 2017-12-29 — End: 2018-04-02

## 2017-12-29 NOTE — Progress Notes (Signed)
Tracy Clements is a 72 y.o. female and presents for Annual Medicare Wellness Visit.    Assessment of cognitive impairment: Alert and oriented x 3    Depression Screen:   3 most recent PHQ Screens 12/10/2017   Little interest or pleasure in doing things Not at all   Feeling down, depressed, irritable, or hopeless Not at all   Total Score PHQ 2 0       Fall Risk Assessment:    Fall Risk Assessment, last 12 mths 12/10/2017   Able to walk? Yes   Fall in past 12 months? No       Abuse Screen:   Abuse Screening Questionnaire 12/10/2017   Do you ever feel afraid of your partner? N   Are you in a relationship with someone who physically or mentally threatens you? N   Is it safe for you to go home? Y       Activities of Daily Living:  Self-care.   Requires assistance with: no ADLs  Patient handle his/her own medications  yes Use of pill box  yes  Activities of Daily Living:   ADL Assessment 12/10/2017   Feeding yourself No Help Needed   Getting from bed to chair No Help Needed   Getting dressed No Help Needed   Bathing or showering No Help Needed   Walk across the room (includes cane/walker) No Help Needed   Using the telphone No Help Needed   Taking your medications No Help Needed   Preparing meals No Help Needed   Managing money (expenses/bills) No Help Needed   Moderately strenuous housework (laundry) No Help Needed   Shopping for personal items (toiletries/medicines) No Help Needed   Shopping for groceries No Help Needed   Driving No Help Needed   Climbing a flight of stairs No Help Needed   Getting to places beyond walking distances No Help Needed       Health Maintenance:  Daily Aspirin: no and recommended to start daily 81mg   Bone Density: up to date 03/2017  Glaucoma Screening: yes 08/2016  Immunizations:    Tetanus: not up to date - script given.  Influenza: declined   Shingles: not up to date - she will get it.Marland Kitchen.  PPSV-23: up to date, will get the second dose. Prevnar-13: up to date in 2018    Cancer screening:     Cervical: was told by Ob/Gyn not needed anymore  Breast: up to date.  Colon: up to date. 2 years ago   Prostate:  N/a    Alcohol Risk Screen:   On any occasion during the past 3 months, have you had more than 3 drinks(female) or 4 drinks (female) containing alcohol in one?  No  Do you average more than 7 drinks (female) or 14 drinks (female) per week?  No  Type and amount:none.    Hearing Loss:  Hearing is good. denies any hearing loss    Vision Loss:   Wears glasses, contact lenses, or have any other visual impairment  yes    Adult Nutrition Screen:  No risk factors noted.    Advance Care Planning:   End of Life Planning: has NO advanced directive  - add't info provided  Offered [facility] [place] ACP-Facilitator appointment yes      Medications/Allergies: Reviewed with patient  Prior to Admission medications    Medication Sig Start Date End Date Taking? Authorizing Provider   losartan-hydroCHLOROthiazide (HYZAAR) 50-12.5 mg per tablet Take 1 Tab by mouth daily  for 90 days. 12/29/17 03/29/18 Yes Vernia Buff, MD   multivitamin (ONE A DAY) tablet Take 1 Tab by mouth daily.   Yes Provider, Historical   fish oil-omega-3 fatty acids (FISH OIL) 340-1,000 mg capsule Take 1 Cap by mouth daily.   Yes Provider, Historical   co-enzyme Q-10 (CO Q-10) 100 mg capsule Take 100 mg by mouth daily.   Yes Provider, Historical   pediatric multivitamin no.42 (CHILD'S GUMMY VITAMIN-MINERAL PO) Take  by mouth.   Yes Provider, Historical   Bacillus coagulans (DIGESTIVE ADVANTAGE PO) Take  by mouth.   Yes Provider, Historical   potassium chloride SA (MICRO-K) 10 mEq capsule Take 10 mEq by mouth daily.   Yes Provider, Historical   simvastatin (ZOCOR) 20 mg tablet Take 20 mg by mouth nightly.   Yes Provider, Historical   meloxicam (MOBIC) 15 mg tablet Take 15 mg by mouth as needed for Pain.   Yes Provider, Historical   esomeprazole (NEXIUM) 40 mg capsule Take 40 mg by mouth daily.   Yes Provider, Historical       Allergies   Allergen  Reactions   ??? Betadine [Povidone-Iodine] Atopic Dermatitis     Whelps and rash   ??? Dexamethasone Other (comments)     Extreme muscle spasm down right leg-taken orally -dose pack   ??? Erythromycin Palpitations   ??? Hydrocodone Other (comments)     "Stomach and bowel problems."   ??? Mold Extracts Sneezing   ??? Other Plant, Animal, Environmental Contact Dermatitis     Walnut trees causes itching.   ??? Sulindac Swelling     Itchy hands and swelling   ??? Tape [Adhesive] Contact Dermatitis     Whelps and rash       Objective:  Visit Vitals  BP 137/67 (BP 1 Location: Left arm, BP Patient Position: Sitting)   Pulse 68   Temp 98.2 ??F (36.8 ??C) (Oral)   Resp 16   Ht 5\' 3"  (1.6 m)   Wt 155 lb 6.4 oz (70.5 kg)   SpO2 100%   BMI 27.53 kg/m??    Body mass index is 27.53 kg/m??.    Problem List: Reviewed with patient and discussed risk factors.    Patient Active Problem List   Diagnosis Code   ??? Mixed hyperlipidemia E78.2   ??? Essential hypertension I10   ??? Spinal stenosis of lumbosacral region M48.07       PSH: Reviewed with patient  Past Surgical History:   Procedure Laterality Date   ??? BREAST SURGERY PROCEDURE UNLISTED  2007    breast reduction   ??? HX BREAST BIOPSY Left 2000    benign   ??? HX BREAST REDUCTION Bilateral    ??? HX GYN  1980    left ovarian cyst removed   ??? HX HEENT  07/2013    face lift   ??? HX HEENT      tonsiloectomy & adnoidectomy   ??? HX ORTHOPAEDIC  2005    right rotator cuff repair   ??? HX OTHER SURGICAL  1987    cyst removed left arm   ??? IR INJ FORAMIN EPID LUMB ANES/STER SNGL  09/29/2017        SH: Reviewed with patient  Social History     Tobacco Use   ??? Smoking status: Never Smoker   ??? Smokeless tobacco: Never Used   Substance Use Topics   ??? Alcohol use: Yes     Comment: very rarely   ??? Drug use:  Never       FH: Reviewed with patient  Family History   Problem Relation Age of Onset   ??? Heart Disease Mother    ??? Heart Disease Father    ??? Stroke Father    ??? Hypertension Father        Current medical providers:    Patient  Care Team:  Vernia Buff, MD as PCP - General (Internal Medicine)    Plan:      Diagnoses and all orders for this visit:    Medicare annual wellness visit, subsequent  Immunization & health screening discussed with her.    ACP (advance care planning)  Advanced directive discussed with her . Referral placed.      Need for diphtheria-tetanus-pertussis (Tdap) vaccine  -     diph,Pertuss,Acell,,Tet Vac-PF (ADACEL) 2 Lf-(2.5-5-3-5 mcg)-5Lf/0.5 mL susp; 0.5 mL by IntraMUSCular route once for 1 dose., Print, Disp-0.5 mL, R-0                   Health Maintenance   Topic Date Due   ??? DTaP/Tdap/Td series (1 - Tdap) 08/02/1967   ??? Shingrix Vaccine Age 71> (1 of 2) 08/01/1996   ??? FOBT Q 1 YEAR AGE 35-75  08/01/1996   ??? Pneumococcal 65+ years (1 of 2 - PCV13) 08/02/2011   ??? MEDICARE YEARLY EXAM  09/17/2016   ??? Influenza Age 30 to Adult  01/29/2018   ??? GLAUCOMA SCREENING Q2Y  05/16/2019   ??? BREAST CANCER SCRN MAMMOGRAM  09/23/2019   ??? Hepatitis C Screening  Completed   ??? Bone Densitometry (Dexa) Screening  Completed          Urinary/ Fecal Incontinence: None.    Regular physical exercise: active, walks daily, 6000 -14,000 steps    Patient verbalized understanding of information presented. AVS and Medicare Part B Preventive Services Table printed and given to pt and reviewed. See table for findings under Recommendation and Scheduled. All of the patient's questions were answered.          Problem Focused Progress Note    Name: Tracy Clements    Today???s Date: 01/01/2018  Ethnicity: PATIENT REFUSED  Race: PATIENT REFUSED  MRN: [id number]  Age: 72 y.o.  DOB: 06-Jul-1945  Sex: Female       HPI:   Tracy Clements is a 72 y.o. year old female who was also seen for Blood pressure follow up. She did bring a B.p log book with her.  Most of the readings are running in normal range. No side effects from the medication.  Labs reviewed. Cholesterol still elevated. Was not much active due to the lower back problem but now has started walking again on  average 10,000 steps & will take zocor daily dose.  .    Review of Systems   Constitutional: negative for fatigue and malaise  Eyes: negative for visual disturbance and redness  Ears, nose, mouth, throat, and face: negative for nasal congestion and hoarseness  Respiratory: negative for cough or dyspnea on exertion  Cardiovascular: negative for chest pain, palpitations, irregular heart beats  Gastrointestinal: negative for reflux symptoms, change in bowel habits and abdominal pain  Musculoskeletal:negative for muscle weakness but positive for lower back pain.  Neurological: negative for headaches, dizziness and seizures  Behavioral/Psych: negative for aggressive behavior, behavior problems and depression    Physical Examination     Visit Vitals  BP 137/67 (BP 1 Location: Left arm, BP Patient Position: Sitting)   Pulse 68  Temp 98.2 ??F (36.8 ??C) (Oral)   Resp 16   Ht 5\' 3"  (1.6 m)   Wt 155 lb 6.4 oz (70.5 kg)   SpO2 100%   BMI 27.53 kg/m??     General:  Alert, cooperative, no distress, appears stated age.   Head:  Normocephalic, without obvious abnormality, atraumatic.   Eyes:  Conjunctivae/corneas clear. PERRL, EOMs intact. Fundi benign.   Ears:  Normal TMs and external ear canals both ears.   Nose: Nares normal. Septum midline. Mucosa normal. No drainage or sinus tenderness.   Throat: Lips, mucosa, and tongue normal. Teeth and gums normal.   Neck: Supple, symmetrical, trachea midline, no adenopathy, thyroid: no enlargement/tenderness/nodules, no carotid bruit and no JVD.   Back:   Symmetric, no curvature. ROM normal. No CVA tenderness.   Lungs:   Clear to auscultation bilaterally.   Chest wall:  No tenderness or deformity.   Heart:  Regular rate and Tracy, S1, S2 normal, no murmur,        Abdomen:   Soft, non-tender. Bowel sounds normal. No masses,            Extremities: Extremities normal, atraumatic, no cyanosis or edema.   Pulses: 2+ and symmetric all extremities.   Skin: Skin color, texture, turgor normal. No  rashes or lesions.   Lymph nodes: Cervical, supraclavicular, and axillary nodes normal.   Neurologic: CNII-XII intact. Normal strength, sensation and reflexes throughout.       Diagnoses and all orders for this visit:      Essential hypertension  -     losartan-hydroCHLOROthiazide (HYZAAR) 50-12.5 mg per tablet; Take 1 Tab by mouth daily for 90 days., Normal, Disp-90 Tab, R-0    Mixed hyperlipidemia  Labs reviewed. Recommended continuing zocor & trying Apple Cider Vinegar along with diet & daily walk. Repeat labs in 4 months.    Spinal stenosis of lumbosacral region  Doing well with PT , will see Dr. Lodema Hong.     Need for diphtheria-tetanus-pertussis (Tdap) vaccine  -     diph,Pertuss,Acell,,Tet Vac-PF (ADACEL) 2 Lf-(2.5-5-3-5 mcg)-5Lf/0.5 mL susp; 0.5 mL by IntraMUSCular route once for 1 dose., Print, Disp-0.5 mL, R-0      Vernia Buff, MD  01/01/2018  12:54 PM

## 2017-12-29 NOTE — Progress Notes (Signed)
 Tracy Clements is a 72 y.o. female    Chief Complaint   Patient presents with   . Hypertension   . Cholesterol Problem     1. Have you been to the ER, urgent care clinic since your last visit?  Hospitalized since your last visit? No    2. Have you seen or consulted any other health care providers outside of the [facility] since your last visit?  Include any pap smears or colon screening. No    Visit Vitals  BP (!) 177/97 (BP 1 Location: Left arm, BP Patient Position: Sitting)   Pulse 68   Temp 98.2 F (36.8 C) (Oral)   Resp 16   Ht 5' 3 (1.6 m)   Wt 155 lb 6.4 oz (70.5 kg)   SpO2 100%   BMI 27.53 kg/m

## 2017-12-29 NOTE — ACP (Advance Care Planning) (Signed)
====  Honoring Choices Invitation====    Patient was invited to Honoring Choices [place] on this date and given the information folder for review.    Recommended appointment with Honoring Choices facilitator for ACP conversation regarding advance directives.    [x] Yes  [] No  Referral sent to Honoring Choices team member or Coordinator for follow-up    [] Yes  [x] No  Patient scheduled an appointment.       Site of Referral: [facility]

## 2017-12-29 NOTE — Progress Notes (Signed)
Tracy Clements is a 72 y.o. female and presents for Annual Medicare Wellness Visit.    Assessment of cognitive impairment: Alert and oriented x 3    Depression Screen:   3 most recent PHQ Screens 12/10/2017   Little interest or pleasure in doing things Not at all   Feeling down, depressed, irritable, or hopeless Not at all   Total Score PHQ 2 0       Fall Risk Assessment:    Fall Risk Assessment, last 12 mths 12/10/2017   Able to walk? Yes   Fall in past 12 months? No       Abuse Screen:   Abuse Screening Questionnaire 12/10/2017   Do you ever feel afraid of your partner? N   Are you in a relationship with someone who physically or mentally threatens you? N   Is it safe for you to go home? Y       Activities of Daily Living:  Self-care.   Requires assistance with: no ADLs  Patient handle his/her own medications  yes Use of pill box  yes  Activities of Daily Living:   ADL Assessment 12/10/2017   Feeding yourself No Help Needed   Getting from bed to chair No Help Needed   Getting dressed No Help Needed   Bathing or showering No Help Needed   Walk across the room (includes cane/walker) No Help Needed   Using the telphone No Help Needed   Taking your medications No Help Needed   Preparing meals No Help Needed   Managing money (expenses/bills) No Help Needed   Moderately strenuous housework (laundry) No Help Needed   Shopping for personal items (toiletries/medicines) No Help Needed   Shopping for groceries No Help Needed   Driving No Help Needed   Climbing a flight of stairs No Help Needed   Getting to places beyond walking distances No Help Needed       Health Maintenance:Daily Aspirin: no and recommended to start daily 81mg   Bone Density: up to date 03/2017  Glaucoma Screening: yes 08/2016  Immunizations:    Tetanus: not up to date - script given.  Influenza: declined   Shingles: not up to date - she will get it.Marland Kitchen  PPSV-23: up to date, will get the second dose. Prevnar-13: up to date in 2018    Cancer screening:     Cervical: was told by Ob/Gyn not needed anymore  Breast: up to date.  Colon: up to date. 2 years ago   Prostate:  N/a    Alcohol Risk Screen:   On any occasion during the past 3 months, have you had more than 3 drinks(female) or 4 drinks (female) containing alcohol in one?  No  Do you average more than 7 drinks (female) or 14 drinks (female) per week?  No  Type and amount:none.    Hearing Loss:  Hearing is good. denies any hearing loss    Vision Loss:   Wears glasses, contact lenses, or have any other visual impairment  yes    Adult Nutrition Screen:  No risk factors noted.    Advance Care Planning: End of Life Planning: has NO advanced directive  - add't info provided  Offered [facility] [place] ACP-Facilitator appointment yes      Medications/Allergies: Reviewed with patient  Prior to Admission medications    Medication Sig Start Date End Date Taking? Authorizing Provider   losartan-hydroCHLOROthiazide (HYZAAR) 50-12.5 mg per tablet Take 1 Tab by mouth daily for 90 days. 12/29/17  03/29/18 Yes Vernia BuffHabib, Shakeena Kafer, MD   multivitamin (ONE A DAY) tablet Take 1 Tab by mouth daily.   Yes Provider, Historical   fish oil-omega-3 fatty acids (FISH OIL) 340-1,000 mg capsule Take 1 Cap by mouth daily.   Yes Provider, Historical   co-enzyme Q-10 (CO Q-10) 100 mg capsule Take 100 mg by mouth daily.   Yes Provider, Historical   pediatric multivitamin no.42 (CHILD'S GUMMY VITAMIN-MINERAL PO) Take  by mouth.   Yes Provider, Historical   Bacillus coagulans (DIGESTIVE ADVANTAGE PO) Take  by mouth.   Yes Provider, Historical   potassium chloride SA (MICRO-K) 10 mEq capsule Take 10 mEq by mouth daily.   Yes Provider, Historical   simvastatin (ZOCOR) 20 mg tablet Take 20 mg by mouth nightly.   Yes Provider, Historical   meloxicam (MOBIC) 15 mg tablet Take 15 mg by mouth as needed for Pain.   Yes Provider, Historical   esomeprazole (NEXIUM) 40 mg capsule Take 40 mg by mouth daily.   Yes Provider, Historical       Allergies    Allergen Reactions   ??? Betadine [Povidone-Iodine] Atopic Dermatitis     Whelps and rash   ??? Dexamethasone Other (comments)     Extreme muscle spasm down right leg-taken orally -dose pack   ??? Erythromycin Palpitations   ??? Hydrocodone Other (comments)     "Stomach and bowel problems."   ??? Mold Extracts Sneezing   ??? Other Plant, Animal, Environmental Contact Dermatitis     Walnut trees causes itching.   ??? Sulindac Swelling     Itchy hands and swelling   ??? Tape [Adhesive] Contact Dermatitis     Whelps and rash     Objective:  Visit Vitals  BP 137/67 (BP 1 Location: Left arm, BP Patient Position: Sitting)   Pulse 68   Temp 98.2 ??F (36.8 ??C) (Oral)   Resp 16   Ht 5\' 3"  (1.6 m)   Wt 155 lb 6.4 oz (70.5 kg)   SpO2 100%   BMI 27.53 kg/m??    Body mass index is 27.53 kg/m??.    Problem List: Reviewed with patient and discussed risk factors.    Patient Active Problem List   Diagnosis Code   ??? Mixed hyperlipidemia E78.2   ??? Essential hypertension I10   ??? Spinal stenosis of lumbosacral region M48.07       PSH: Reviewed with patient  Past Surgical History:   Procedure Laterality Date   ??? BREAST SURGERY PROCEDURE UNLISTED  2007    breast reduction   ??? HX BREAST BIOPSY Left 2000    benign   ??? HX BREAST REDUCTION Bilateral    ??? HX GYN  1980    left ovarian cyst removed   ??? HX HEENT  07/2013    face lift   ??? HX HEENT      tonsiloectomy & adnoidectomy   ??? HX ORTHOPAEDIC  2005    right rotator cuff repair   ??? HX OTHER SURGICAL  1987    cyst removed left arm   ??? IR INJ FORAMIN EPID LUMB ANES/STER SNGL  09/29/2017        SH: Reviewed with patient  Social History     Tobacco Use   ??? Smoking status: Never Smoker   ??? Smokeless tobacco: Never Used   Substance Use Topics   ??? Alcohol use: Yes     Comment: very rarely   ??? Drug use: Never  FH: Reviewed with patient  Family History   Problem Relation Age of Onset   ??? Heart Disease Mother    ??? Heart Disease Father    ??? Stroke Father    ??? Hypertension Father        Current medical providers:     Patient Care Team:  Vernia Buff, MD as PCP - General (Internal Medicine)  Plan:      Diagnoses and all orders for this visit:    Medicare annual wellness visit, subsequent  Immunization & health screening discussed with her.    ACP (advance care planning)  Advanced directive discussed with her . Referral placed.      Need for diphtheria-tetanus-pertussis (Tdap) vaccine  -     diph,Pertuss,Acell,,Tet Vac-PF (ADACEL) 2 Lf-(2.5-5-3-5 mcg)-5Lf/0.5 mL susp; 0.5 mL by IntraMUSCular route once for 1 dose., Print, Disp-0.5 mL, R-0                   Health Maintenance   Topic Date Due   ??? DTaP/Tdap/Td series (1 - Tdap) 08/02/1967   ??? Shingrix Vaccine Age 57> (1 of 2) 08/01/1996   ??? FOBT Q 1 YEAR AGE 51-75  08/01/1996   ??? Pneumococcal 65+ years (1 of 2 - PCV13) 08/02/2011   ??? MEDICARE YEARLY EXAM  09/17/2016   ??? Influenza Age 9 to Adult  01/29/2018   ??? GLAUCOMA SCREENING Q2Y  05/16/2019   ??? BREAST CANCER SCRN MAMMOGRAM  09/23/2019   ??? Hepatitis C Screening  Completed   ??? Bone Densitometry (Dexa) Screening  Completed          Urinary/ Fecal Incontinence: None.    Regular physical exercise: active, walks daily, 6000 -14,000 steps    Patient verbalized understanding of information presented. AVS and Medicare Part B Preventive Services Table printed and given to pt and reviewed. See table for findings under Recommendation and Scheduled. All of the patient's questions were answered.          Problem Focused Progress Note    Name: Tracy Clements    Today???s Date: 01/01/2018  Ethnicity: PATIENT REFUSED  Race: PATIENT REFUSED  MRN: [id number]  Age: 72 y.o.  DOB: 08/22/1945  Sex: Female       HPI:   Luverna Degenhart is a 72 y.o. year old female who was also seen for Blood pressure follow up. She did bring a B.p log book with her.  Most of the readings are running in normal range. No side effects from the medication.  Labs reviewed. Cholesterol still elevated. Was not much active due to the  lower back problem but now has started walking again on average 10,000 steps & will take zocor daily dose.  .    Review of Systems   Constitutional: negative for fatigue and malaise  Eyes: negative for visual disturbance and redness  Ears, nose, mouth, throat, and face: negative for nasal congestion and hoarseness  Respiratory: negative for cough or dyspnea on exertion  Cardiovascular: negative for chest pain, palpitations, irregular heart beats  Gastrointestinal: negative for reflux symptoms, change in bowel habits and abdominal pain  Musculoskeletal:negative for muscle weakness but positive for lower back pain.  Neurological: negative for headaches, dizziness and seizures  Behavioral/Psych: negative for aggressive behavior, behavior problems and depression    Physical Examination     Visit Vitals  BP 137/67 (BP 1 Location: Left arm, BP Patient Position: Sitting)   Pulse 68   Temp 98.2 ??F (36.8 ??C) (Oral)   Resp  16   Ht 5\' 3"  (1.6 m)   Wt 155 lb 6.4 oz (70.5 kg)   SpO2 100%   BMI 27.53 kg/m??     General:  Alert, cooperative, no distress, appears stated age.   Head:  Normocephalic, without obvious abnormality, atraumatic.   Eyes:  Conjunctivae/corneas clear. PERRL, EOMs intact. Fundi benign.   Ears:  Normal TMs and external ear canals both ears.   Nose: Nares normal. Septum midline. Mucosa normal. No drainage or sinus tenderness.   Throat: Lips, mucosa, and tongue normal. Teeth and gums normal.   Neck: Supple, symmetrical, trachea midline, no adenopathy, thyroid: no enlargement/tenderness/nodules, no carotid bruit and no JVD.   Back:   Symmetric, no curvature. ROM normal. No CVA tenderness.   Lungs:   Clear to auscultation bilaterally.   Chest wall:  No tenderness or deformity.   Heart:  Regular rate and rhythm, S1, S2 normal, no murmur,        Abdomen:   Soft, non-tender. Bowel sounds normal. No masses,            Extremities: Extremities normal, atraumatic, no cyanosis or edema.    Pulses: 2+ and symmetric all extremities.   Skin: Skin color, texture, turgor normal. No rashes or lesions.   Lymph nodes: Cervical, supraclavicular, and axillary nodes normal.   Neurologic: CNII-XII intact. Normal strength, sensation and reflexes throughout.       Diagnoses and all orders for this visit:      Essential hypertension  -     losartan-hydroCHLOROthiazide (HYZAAR) 50-12.5 mg per tablet; Take 1 Tab by mouth daily for 90 days., Normal, Disp-90 Tab, R-0    Mixed hyperlipidemia  Labs reviewed. Recommended continuing zocor & trying Apple Cider Vinegar along with diet & daily walk. Repeat labs in 4 months.    Spinal stenosis of lumbosacral region  Doing well with PT , will see Dr. Lodema Hong.     Need for diphtheria-tetanus-pertussis (Tdap) vaccine  -     diph,Pertuss,Acell,,Tet Vac-PF (ADACEL) 2 Lf-(2.5-5-3-5 mcg)-5Lf/0.5 mL susp; 0.5 mL by IntraMUSCular route once for 1 dose., Print, Disp-0.5 mL, R-0      Vernia Buff, MD  01/01/2018  12:54 PM

## 2017-12-29 NOTE — Progress Notes (Signed)
Tracy Clements is a 72 y.o. female    Chief Complaint   Patient presents with   ??? Hypertension   ??? Cholesterol Problem     1. Have you been to the ER, urgent care clinic since your last visit?  Hospitalized since your last visit? No    2. Have you seen or consulted any other health care providers outside of the [facility] since your last visit?  Include any pap smears or colon screening. No    Visit Vitals  BP (!) 177/97 (BP 1 Location: Left arm, BP Patient Position: Sitting)   Pulse 68   Temp 98.2 ??F (36.8 ??C) (Oral)   Resp 16   Ht 5' 3" (1.6 m)   Wt 155 lb 6.4 oz (70.5 kg)   SpO2 100%   BMI 27.53 kg/m??

## 2017-12-29 NOTE — Telephone Encounter (Signed)
From: Tracy Clements  To: Vernia BuffHabib, Aysha, MD  Sent: 12/29/2017 3:44 PM EDT  Subject: Update Medical Information    Shot history update for Tracy Clements:  2013 - pnumovax 23  2018 prevonar 13 (at [facility])  No tetnus (will use your prescription)  Found RX for shingryx (will try again to get shot)

## 2017-12-29 NOTE — ACP (Advance Care Planning) (Signed)
====  [facility]oring Choices Invitation====    Patient was invited to [facility] [place] on this date and given the information folder for review.    Recommended appointment with [facility] facilitator for ACP conversation regarding advance directives.    [x] Yes  [] No  Referral sent to [facility] team member or Coordinator for follow-up    [] Yes  [x] No  Patient scheduled an appointment.       Site of Referral: [facility]

## 2018-01-22 ENCOUNTER — Ambulatory Visit: Admit: 2018-01-22 | Discharge: 2018-01-22 | Payer: MEDICARE | Attending: Internal Medicine | Primary: Internal Medicine

## 2018-01-22 ENCOUNTER — Ambulatory Visit: Attending: Internal Medicine | Primary: Internal Medicine

## 2018-01-22 DIAGNOSIS — L03317 Cellulitis of buttock: Secondary | ICD-10-CM

## 2018-01-22 NOTE — Progress Notes (Signed)
Written by Aliene Beams, [place], as dictated by Dr. Zada Finders, MD.    HISTORY OF PRESENT ILLNESS  Tracy Clements is a 72 y.o. female.  HPI  The patient presents today c/o abscess on tailbone. On 07/19 she started having some pain, which worsened over the next few days. The pt saw blood in the shower on 01/19/2018. Patient went to [facility] on 01/19/2018 and was told it was infected. The area was cleaned and pt was prescribed Bactrim BID x 10 days. She started Bactrim on 01/20/2018 and is taking double her probiotic per [facility]. She has been taking daily hot baths. The abscess is no longer painful or itching. Patient has a HX of cysts in this area.    Patient Active Problem List   Diagnosis Code   ??? Mixed hyperlipidemia E78.2   ??? Essential hypertension I10   ??? 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 losartan-hydroCHLOROthiazide (HYZAAR) 50-12.5 mg per tablet Take 1 Tab by mouth daily for 90 days. 90 Tab 0   ??? multivitamin (ONE A DAY) tablet Take 1 Tab by mouth daily.     ??? fish oil-omega-3 fatty acids (FISH OIL) 340-1,000 mg capsule Take 1 Cap by mouth daily.     ??? co-enzyme Q-10 (CO Q-10) 100 mg capsule Take 100 mg by mouth daily.     ??? pediatric multivitamin no.42 (CHILD'S GUMMY VITAMIN-MINERAL PO) Take  by mouth.     ??? Bacillus coagulans (DIGESTIVE ADVANTAGE PO) Take  by mouth.     ??? potassium chloride SA (MICRO-K) 10 mEq capsule Take 10 mEq by mouth daily.     ??? simvastatin (ZOCOR) 20 mg tablet Take 20 mg by mouth nightly.     ??? meloxicam (MOBIC) 15 mg tablet Take 15 mg by mouth as needed for Pain.     ??? esomeprazole (NEXIUM) 40 mg capsule Take 40 mg by mouth daily.       No current facility-administered medications on file prior to visit.        Allergies   Allergen Reactions   ??? Betadine [Povidone-Iodine] Atopic Dermatitis     Whelps and rash   ??? Dexamethasone Other (comments)     Extreme  muscle spasm down right leg-taken orally -dose pack   ??? Erythromycin Palpitations   ??? Hydrocodone Other (comments)     "Stomach and bowel problems."   ??? Mold Extracts Sneezing   ??? Other Plant, Animal, Environmental Contact Dermatitis     Walnut trees causes itching.   ??? Sulindac Swelling     Itchy hands and swelling   ??? Tape [Adhesive] Contact Dermatitis     Whelps and rash       Past Medical History:   Diagnosis Date   ??? Arthritis     osteroarthritis   ??? Cancer ([place])     ? skin cancer   ??? Chronic pain     low back pain   ??? GERD (gastroesophageal reflux disease)    ??? Hyperlipidemia    ??? Hypertension    ??? Menopause 2004       Past Surgical History:   Procedure Laterality Date   ??? BREAST SURGERY PROCEDURE UNLISTED  2007    breast reduction   ??? HX BREAST BIOPSY Left 2000    benign   ??? HX BREAST REDUCTION Bilateral    ???  HX GYN  1980    left ovarian cyst removed   ??? HX HEENT  07/2013    face lift   ??? HX HEENT      tonsiloectomy & adnoidectomy   ??? HX ORTHOPAEDIC  2005    right rotator cuff repair   ??? HX OTHER SURGICAL  1987    cyst removed left arm   ??? IR INJ FORAMIN EPID LUMB ANES/STER SNGL  09/29/2017       Family History   Problem Relation Age of Onset   ??? Heart Disease Mother    ??? Heart Disease Father    ??? Stroke Father    ??? Hypertension Father        Social History     Socioeconomic History   ??? Marital status: MARRIED     Spouse name: Not on file   ??? Number of children: Not on file   ??? Years of education: Not on file   ??? Highest education level: Not on file   Occupational History   ??? Not on file   Social Needs   ??? Financial resource strain: Not on file   ??? Food insecurity:     Worry: Not on file     Inability: Not on file   ??? Transportation needs:     Medical: Not on file     Non-medical: Not on file   Tobacco Use   ??? Smoking status: Never Smoker   ??? Smokeless tobacco: Never Used   Substance and Sexual Activity   ??? Alcohol use: Yes     Comment: very rarely   ??? Drug use: Never   ??? Sexual activity: Not Currently    Lifestyle   ??? Physical activity:     Days per week: Not on file     Minutes per session: Not on file   ??? Stress: Not on file   Relationships   ??? Social connections:     Talks on phone: Not on file     Gets together: Not on file     Attends religious service: Not on file     Active member of club or organization: Not on file     Attends meetings of clubs or organizations: Not on file     Relationship status: Not on file   ??? Intimate partner violence:     Fear of current or ex partner: Not on file     Emotionally abused: Not on file     Physically abused: Not on file     Forced sexual activity: Not on file   Other Topics Concern   ??? Not on file   Social History Narrative   ??? Not on file       Orders Only on 12/11/2017   Component Date Value Ref Range Status   ??? TSH 12/11/2017 3.530  0.450 - 4.500 uIU/mL Final   ??? Hep C Virus Ab 12/11/2017 <0.1  0.0 - 0.9 s/co ratio Final   ??? Cholesterol, total 12/11/2017 223* 100 - 199 mg/dL Final   ??? Triglyceride 12/11/2017 59  0 - 149 mg/dL Final   ??? HDL Cholesterol 12/11/2017 95  >39 mg/dL Final   ??? VLDL, calculated 12/11/2017 12  5 - 40 mg/dL Final   ??? LDL, calculated 12/11/2017 116* 0 - 99 mg/dL Final   ??? Glucose 12/11/2017 98  65 - 99 mg/dL Final   ??? BUN 12/11/2017 15  8 - 27 mg/dL Final   ??? Creatinine  12/11/2017 0.88  0.57 - 1.00 mg/dL Final   ??? GFR est non-AA 12/11/2017 66  >59 mL/min/1.73 Final   ??? GFR est AA 12/11/2017 76  >59 mL/min/1.73 Final   ??? BUN/Creatinine ratio 12/11/2017 17  12 - 28 Final   ??? Sodium 12/11/2017 142  134 - 144 mmol/L Final   ??? Potassium 12/11/2017 3.6  3.5 - 5.2 mmol/L Final   ??? Chloride 12/11/2017 100  96 - 106 mmol/L Final   ??? CO2 12/11/2017 27  20 - 29 mmol/L Final   ??? Calcium 12/11/2017 9.3  8.7 - 10.3 mg/dL Final   ??? Protein, total 12/11/2017 6.6  6.0 - 8.5 g/dL Final   ??? Albumin 12/11/2017 4.3  3.5 - 4.8 g/dL Final   ??? GLOBULIN, TOTAL 12/11/2017 2.3  1.5 - 4.5 g/dL Final   ??? A-G Ratio 12/11/2017 1.9  1.2 - 2.2 Final   ??? Bilirubin, total  12/11/2017 0.3  0.0 - 1.2 mg/dL Final   ??? Alk. phosphatase 12/11/2017 102  39 - 117 IU/L Final   ??? AST (SGOT) 12/11/2017 35  0 - 40 IU/L Final   ??? ALT (SGPT) 12/11/2017 31  0 - 32 IU/L Final   ??? WBC 12/11/2017 5.3  3.4 - 10.8 x10E3/uL Final   ??? RBC 12/11/2017 4.23  3.77 - 5.28 x10E6/uL Final   ??? HGB 12/11/2017 13.0  11.1 - 15.9 g/dL Final   ??? HCT 12/11/2017 38.4  34.0 - 46.6 % Final   ??? MCV 12/11/2017 91  79 - 97 fL Final   ??? MCH 12/11/2017 30.7  26.6 - 33.0 pg Final   ??? MCHC 12/11/2017 33.9  31.5 - 35.7 g/dL Final   ??? RDW 12/11/2017 14.4  12.3 - 15.4 % Final   ??? PLATELET 12/11/2017 245  150 - 450 x10E3/uL Final       Review of Systems   Constitutional: Negative for malaise/fatigue.   Respiratory: Negative for cough and shortness of breath.    Musculoskeletal: Negative for joint pain and myalgias.   Skin:        +cyst on tailbone   Neurological: Negative for dizziness, tingling, sensory change, weakness and headaches.   Psychiatric/Behavioral: Negative for depression, memory loss and substance abuse.     Visit Vitals  BP 116/76 (BP 1 Location: Left arm, BP Patient Position: Sitting)   Pulse 65   Temp 98.6 ??F (37 ??C) (Oral)   Resp 16   Ht 5' 3"  (1.6 m)   Wt 155 lb (70.3 kg)   SpO2 98%   BMI 27.46 kg/m??       Physical Exam   Constitutional: She is oriented to person, place, and time. She appears well-developed and well-nourished. No distress.   HENT:   Right Ear: External ear normal.   Left Ear: External ear normal.   Eyes: Conjunctivae and EOM are normal. Right eye exhibits no discharge. Left eye exhibits no discharge.   Neck: Normal range of motion. Neck supple.   Cardiovascular: Normal rate and regular rhythm.   Pulmonary/Chest: Effort normal and breath sounds normal. She has no wheezes.   Abdominal: Soft. Bowel sounds are normal. There is no tenderness.   Lymphadenopathy:     She has no cervical adenopathy.   Neurological: She is alert and oriented to person, place, and time.   Skin: She is not diaphoretic.    Tailbone area open wound, slight redness, no tenderness   Psychiatric: She has a normal mood and affect. Her  behavior is normal.   Nursing note and vitals reviewed.      ASSESSMENT and PLAN    ICD-10-CM ICD-9-CM    1. Cellulitis of buttock L03.317 682.5 Patient should continue Bactrim. She should let me know by tomorrow if the pain worsens or she has more discharge and I will prescribe Clindamycin x 3 days. Instructed pt to keep the area clean and dry. She should not immerse herself in water shared with other people.   2. Abscess L02.91 682.9 Patient should continue Bactrim. She should let me know by tomorrow if the pain worsens or she has more discharge and I will prescribe Clindamycin x 3 days.         This plan was reviewed with the patient and patient agrees. All questions were answered.    This scribe documentation was reviewed by me and accurately reflects the examination and decisions made by me.    This note will not be viewable in [place].

## 2018-01-22 NOTE — Progress Notes (Signed)
 Chief Complaint   Patient presents with   . Follow-up     patient states that she noticed some bleeding on sunday in the shower, states that [facility] went to [facility] and it is abcess that is infected and told to follow up with pcp to check.  states that she got antibiotic and doubled her probiotic

## 2018-01-22 NOTE — Progress Notes (Signed)
Chief Complaint   Patient presents with   ??? Follow-up     patient states that she noticed some bleeding on sunday in the shower, states that [facility] went [facility] and it is abcess that is infected and told to follow up with pcp to check.  states that she got antibiotic and doubled her probiotic

## 2018-01-22 NOTE — Progress Notes (Signed)
Written by Aliene Beams, [place], as dictated by Dr. Zada Finders, MD.    HISTORY OF PRESENT ILLNESS  Tracy Clements is a 72 y.o. female.  HPI  The patient presents today c/o abscess on tailbone. On 07/19 she started having some pain, which worsened over the next few days. The pt saw blood in the shower on 01/19/2018. Patient went to [facility] on 01/19/2018 and was told it was infected. The area was cleaned and pt was prescribed Bactrim BID x 10 days. She started Bactrim on 01/20/2018 and is taking double her probiotic per [facility]. She has been taking daily hot baths. The abscess is no longer painful or itching. Patient has a HX of cysts in this area.    Patient Active Problem List   Diagnosis Code   ??? Mixed hyperlipidemia E78.2   ??? Essential hypertension I10   ??? 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 losartan-hydroCHLOROthiazide (HYZAAR) 50-12.5 mg per tablet Take 1 Tab by mouth daily for 90 days. 90 Tab 0   ??? multivitamin (ONE A DAY) tablet Take 1 Tab by mouth daily.     ??? fish oil-omega-3 fatty acids (FISH OIL) 340-1,000 mg capsule Take 1 Cap by mouth daily.     ??? co-enzyme Q-10 (CO Q-10) 100 mg capsule Take 100 mg by mouth daily.     ??? pediatric multivitamin no.42 (CHILD'S GUMMY VITAMIN-MINERAL PO) Take  by mouth.     ??? Bacillus coagulans (DIGESTIVE ADVANTAGE PO) Take  by mouth.     ??? potassium chloride SA (MICRO-K) 10 mEq capsule Take 10 mEq by mouth daily.     ??? simvastatin (ZOCOR) 20 mg tablet Take 20 mg by mouth nightly.     ??? meloxicam (MOBIC) 15 mg tablet Take 15 mg by mouth as needed for Pain.     ??? esomeprazole (NEXIUM) 40 mg capsule Take 40 mg by mouth daily.       No current facility-administered medications on file prior to visit.        Allergies   Allergen Reactions   ??? Betadine [Povidone-Iodine] Atopic Dermatitis     Whelps and rash   ??? Dexamethasone Other (comments)      Extreme muscle spasm down right leg-taken orally -dose pack   ??? Erythromycin Palpitations   ??? Hydrocodone Other (comments)     "Stomach and bowel problems."   ??? Mold Extracts Sneezing   ??? Other Plant, Animal, Environmental Contact Dermatitis     Walnut trees causes itching.   ??? Sulindac Swelling     Itchy hands and swelling   ??? Tape [Adhesive] Contact Dermatitis     Whelps and rash       Past Medical History:   Diagnosis Date   ??? Arthritis     osteroarthritis   ??? Cancer ([place])     ? skin cancer   ??? Chronic pain     low back pain   ??? GERD (gastroesophageal reflux disease)    ??? Hyperlipidemia    ??? Hypertension    ??? Menopause 2004       Past Surgical History:   Procedure Laterality Date   ??? BREAST SURGERY PROCEDURE UNLISTED  2007    breast reduction   ??? HX BREAST BIOPSY Left 2000    benign   ??? HX BREAST REDUCTION Bilateral    ???  HX GYN  1980    left ovarian cyst removed   ??? HX HEENT  07/2013    face lift   ??? HX HEENT      tonsiloectomy & adnoidectomy   ??? HX ORTHOPAEDIC  2005    right rotator cuff repair   ??? HX OTHER SURGICAL  1987    cyst removed left arm   ??? IR INJ FORAMIN EPID LUMB ANES/STER SNGL  09/29/2017       Family History   Problem Relation Age of Onset   ??? Heart Disease Mother    ??? Heart Disease Father    ??? Stroke Father    ??? Hypertension Father        Social History     Socioeconomic History   ??? Marital status: MARRIED     Spouse name: Not on file   ??? Number of children: Not on file   ??? Years of education: Not on file   ??? Highest education level: Not on file   Occupational History   ??? Not on file   Social Needs   ??? Financial resource strain: Not on file   ??? Food insecurity:     Worry: Not on file     Inability: Not on file   ??? Transportation needs:     Medical: Not on file     Non-medical: Not on file   Tobacco Use   ??? Smoking status: Never Smoker   ??? Smokeless tobacco: Never Used   Substance and Sexual Activity   ??? Alcohol use: Yes     Comment: very rarely   ??? Drug use: Never    ??? Sexual activity: Not Currently   Lifestyle   ??? Physical activity:     Days per week: Not on file     Minutes per session: Not on file   ??? Stress: Not on file   Relationships   ??? Social connections:     Talks on phone: Not on file     Gets together: Not on file     Attends religious service: Not on file     Active member of club or organization: Not on file     Attends meetings of clubs or organizations: Not on file     Relationship status: Not on file   ??? Intimate partner violence:     Fear of current or ex partner: Not on file     Emotionally abused: Not on file     Physically abused: Not on file     Forced sexual activity: Not on file   Other Topics Concern   ??? Not on file   Social History Narrative   ??? Not on file       Orders Only on 12/11/2017   Component Date Value Ref Range Status   ??? TSH 12/11/2017 3.530  0.450 - 4.500 uIU/mL Final   ??? Hep C Virus Ab 12/11/2017 <0.1  0.0 - 0.9 s/co ratio Final   ??? Cholesterol, total 12/11/2017 223* 100 - 199 mg/dL Final   ??? Triglyceride 12/11/2017 59  0 - 149 mg/dL Final   ??? HDL Cholesterol 12/11/2017 95  >39 mg/dL Final   ??? VLDL, calculated 12/11/2017 12  5 - 40 mg/dL Final   ??? LDL, calculated 12/11/2017 116* 0 - 99 mg/dL Final   ??? Glucose 12/11/2017 98  65 - 99 mg/dL Final   ??? BUN 12/11/2017 15  8 - 27 mg/dL Final   ??? Creatinine  12/11/2017 0.88  0.57 - 1.00 mg/dL Final   ??? GFR est non-AA 12/11/2017 66  >59 mL/min/1.73 Final   ??? GFR est AA 12/11/2017 76  >59 mL/min/1.73 Final   ??? BUN/Creatinine ratio 12/11/2017 17  12 - 28 Final   ??? Sodium 12/11/2017 142  134 - 144 mmol/L Final   ??? Potassium 12/11/2017 3.6  3.5 - 5.2 mmol/L Final   ??? Chloride 12/11/2017 100  96 - 106 mmol/L Final   ??? CO2 12/11/2017 27  20 - 29 mmol/L Final   ??? Calcium 12/11/2017 9.3  8.7 - 10.3 mg/dL Final   ??? Protein, total 12/11/2017 6.6  6.0 - 8.5 g/dL Final   ??? Albumin 12/11/2017 4.3  3.5 - 4.8 g/dL Final   ??? GLOBULIN, TOTAL 12/11/2017 2.3  1.5 - 4.5 g/dL Final    ??? A-G Ratio 12/11/2017 1.9  1.2 - 2.2 Final   ??? Bilirubin, total 12/11/2017 0.3  0.0 - 1.2 mg/dL Final   ??? Alk. phosphatase 12/11/2017 102  39 - 117 IU/L Final   ??? AST (SGOT) 12/11/2017 35  0 - 40 IU/L Final   ??? ALT (SGPT) 12/11/2017 31  0 - 32 IU/L Final   ??? WBC 12/11/2017 5.3  3.4 - 10.8 x10E3/uL Final   ??? RBC 12/11/2017 4.23  3.77 - 5.28 x10E6/uL Final   ??? HGB 12/11/2017 13.0  11.1 - 15.9 g/dL Final   ??? HCT 12/11/2017 38.4  34.0 - 46.6 % Final   ??? MCV 12/11/2017 91  79 - 97 fL Final   ??? MCH 12/11/2017 30.7  26.6 - 33.0 pg Final   ??? MCHC 12/11/2017 33.9  31.5 - 35.7 g/dL Final   ??? RDW 12/11/2017 14.4  12.3 - 15.4 % Final   ??? PLATELET 12/11/2017 245  150 - 450 x10E3/uL Final       Review of Systems   Constitutional: Negative for malaise/fatigue.   Respiratory: Negative for cough and shortness of breath.    Musculoskeletal: Negative for joint pain and myalgias.   Skin:        +cyst on tailbone   Neurological: Negative for dizziness, tingling, sensory change, weakness and headaches.   Psychiatric/Behavioral: Negative for depression, memory loss and substance abuse.     Visit Vitals  BP 116/76 (BP 1 Location: Left arm, BP Patient Position: Sitting)   Pulse 65   Temp 98.6 ??F (37 ??C) (Oral)   Resp 16   Ht '5\' 3"'$  (1.6 m)   Wt 155 lb (70.3 kg)   SpO2 98%   BMI 27.46 kg/m??       Physical Exam   Constitutional: She is oriented to person, place, and time. She appears well-developed and well-nourished. No distress.   HENT:   Right Ear: External ear normal.   Left Ear: External ear normal.   Eyes: Conjunctivae and EOM are normal. Right eye exhibits no discharge. Left eye exhibits no discharge.   Neck: Normal range of motion. Neck supple.   Cardiovascular: Normal rate and regular rhythm.   Pulmonary/Chest: Effort normal and breath sounds normal. She has no wheezes.   Abdominal: Soft. Bowel sounds are normal. There is no tenderness.   Lymphadenopathy:     She has no cervical adenopathy.    Neurological: She is alert and oriented to person, place, and time.   Skin: She is not diaphoretic.   Tailbone area open wound, slight redness, no tenderness   Psychiatric: She has a normal mood and affect. Her  behavior is normal.   Nursing note and vitals reviewed.      ASSESSMENT and PLAN    ICD-10-CM ICD-9-CM    1. Cellulitis of buttock L03.317 682.5 Patient should continue Bactrim. She should let me know by tomorrow if the pain worsens or she has more discharge and I will prescribe Clindamycin x 3 days. Instructed pt to keep the area clean and dry. She should not immerse herself in water shared with other people.   2. Abscess L02.91 682.9 Patient should continue Bactrim. She should let me know by tomorrow if the pain worsens or she has more discharge and I will prescribe Clindamycin x 3 days.         This plan was reviewed with the patient and patient agrees. All questions were answered.    This scribe documentation was reviewed by me and accurately reflects the examination and decisions made by me.    This note will not be viewable in [place].

## 2018-02-01 ENCOUNTER — Emergency Department: Admit: 2018-02-02 | Payer: MEDICARE | Primary: Internal Medicine

## 2018-02-01 DIAGNOSIS — S22069A Unspecified fracture of T7-T8 vertebra, initial encounter for closed fracture: Secondary | ICD-10-CM

## 2018-02-01 NOTE — ED Notes (Signed)
Triage: Pt arrives from home with CC of a fall through the floor in her attic. Per pt she was walking and fell through the floor and landed on her right side onto a carpeted surface. Pt denies hitting her head or LOC. She reports some back pain and some pain along her right side. Pt denies taking blood thinners. Pt reports she was ambulatory after the fall.

## 2018-02-01 NOTE — ED Notes (Signed)
Superficial abrasions cleansed with wound cleaners and noted to back of LLE and RIGHT upper back.

## 2018-02-01 NOTE — ED Notes (Signed)
Superficial abrasions cleansed with wound cleaners and noted to back of LLE and RIGHT upper back.

## 2018-02-01 NOTE — ED Triage Notes (Signed)
Triage: Pt arrives from home with CC of a fall through the floor in her attic. Per pt she was walking and fell through the floor and landed on her right side onto a carpeted surface. Pt denies hitting her head or LOC. She reports some back pain and some pain along her right side. Pt denies taking blood thinners. Pt reports she was ambulatory after the fall.

## 2018-02-02 ENCOUNTER — Inpatient Hospital Stay
Admit: 2018-02-02 | Discharge: 2018-02-02 | Disposition: A | Discharge status: Home / Self Care | Payer: MEDICARE | Attending: Emergency Medicine

## 2018-02-02 MED ORDER — ACETAMINOPHEN 500 MG TAB
500 mg | Freq: Once | ORAL | Status: AC
Start: 2018-02-02 — End: 2018-02-01
  Administered 2018-02-02: 04:00:00 via ORAL

## 2018-02-02 MED FILL — MAPAP EXTRA STRENGTH 500 MG TABLET: 500 mg | ORAL | Qty: 2

## 2018-02-02 NOTE — Progress Notes (Signed)
ED Discharge Follow-Up    Date/Time:     02/02/2018 11:52 AM    [facility] and was diagnosed with thoracic fracture.       Top Challenges reviewed with the provider   - patient with T 8-9 fracture per ortho - no intervention needed will heal on its own              Method of communication with provider :chart routing    Nurse Navigator(NN) contacted the  patient  by telephone to perform post ED discharge assessment. Verified name and DOB with patient as identifiers. Provided introduction to self, and explanation of the Nurse Navigator role.      Patient reported assessment: Patient reports was up in her attic last night around 11pm when she stepped over a rafter and fell through the ceiling. She proceeded to the ER . CT showed spinous process fx. She followed up this AM with ortho Dr Lodema HongSimpson and more xrays were done and showed fractures at T8-9 . She was told there was no intervention/ surgery needed and she should return to regular activities/ exercise and these would heal on their own. Tylenol is controlling the pain. Patient also notes was recently tx'd for abscess on tailbone area, she is on her last few days of antibiotics and this is healing up nicely. Patient has no further concerns or question.       Medication(s):   New Medications at Discharge: none  Changed Medications at Discharge: none  Discontinued Medications at Discharge: none    There were no barriers to obtaining medications identified at this time.    Reviewed discharge instructions and red flags with  patient who voiced understanding. Patient given an opportunity to ask questions and does not have any further questions or concerns at this time. The patient agrees to contact the PCP office for questions related to their healthcare.     Patient reminded that there are physicians on call 24 hours a day / 7 days a week (M-F 5pm to 8am and from Friday 5pm until Monday 8am for the weekend) should the patient  have questions or concerns. NN provided contact information for future reference.      Offered follow up appointment with PCP: no   [facility] follow up appointment(s): No future appointments.   Non-[facility] follow up appointment(s): patient saw ortho Dr Lodema HongSimpson this am  [facility]:  n/a    [email or web address] 9 AM- 9 PM.   Phone [phone]

## 2018-02-02 NOTE — ED Provider Notes (Signed)
The history is provided by the patient and the spouse. No language interpreter was used.   Fall   The accident occurred less than 1 hour ago. She fell from a height of 6 - 10 ft. She landed on carpet. There was no blood loss. The pain is at a severity of 6/10. The pain is moderate. She was ambulatory at the scene. There was no entrapment after the fall. There was no drug use involved in the accident. There was no alcohol use involved in the accident. Pertinent negatives include no visual change, no fever, no numbness, no abdominal pain, no bowel incontinence, no nausea, no vomiting, no hematuria, no headaches, no extremity weakness, no hearing loss, no loss of consciousness, no tingling and no laceration. The risk factors include being elderly.  The symptoms are aggravated by pressure on injury. She has tried nothing for the symptoms. The treatment provided no relief. It is unknown when the patient last had a tetanus shot.        Past Medical History:   Diagnosis Date   ??? Arthritis     osteroarthritis   ??? Cancer ([facility])     ? skin cancer   ??? Chronic pain     low back pain   ??? GERD (gastroesophageal reflux disease)    ??? Hyperlipidemia    ??? Hypertension    ??? Menopause 2004       Past Surgical History:   Procedure Laterality Date   ??? BREAST SURGERY PROCEDURE UNLISTED  2007    breast reduction   ??? HX BREAST BIOPSY Left 2000    benign   ??? HX BREAST REDUCTION Bilateral    ??? HX GYN  1980    left ovarian cyst removed   ??? HX HEENT  07/2013    face lift   ??? HX HEENT      tonsiloectomy & adnoidectomy   ??? HX ORTHOPAEDIC  2005    right rotator cuff repair   ??? HX OTHER SURGICAL  1987    cyst removed left arm   ??? IR INJ FORAMIN EPID LUMB ANES/STER SNGL  09/29/2017         Family History:   Problem Relation Age of Onset   ??? Heart Disease Mother    ??? Heart Disease Father    ??? Stroke Father    ??? Hypertension Father        Social History     Socioeconomic History   ??? Marital status: MARRIED     Spouse name: Not on file   ??? Number of  children: Not on file   ??? Years of education: Not on file   ??? Highest education level: Not on file   Occupational History   ??? Not on file   Social Needs   ??? Financial resource strain: Not on file   ??? Food insecurity:     Worry: Not on file     Inability: Not on file   ??? Transportation needs:     Medical: Not on file     Non-medical: Not on file   Tobacco Use   ??? Smoking status: Never Smoker   ??? Smokeless tobacco: Never Used   Substance and Sexual Activity   ??? Alcohol use: Yes     Comment: very rarely   ??? Drug use: Never   ??? Sexual activity: Not Currently   Lifestyle   ??? Physical activity:     Days per week: Not on file  Minutes per session: Not on file   ??? Stress: Not on file   Relationships   ??? Social connections:     Talks on phone: Not on file     Gets together: Not on file     Attends religious service: Not on file     Active member of club or organization: Not on file     Attends meetings of clubs or organizations: Not on file     Relationship status: Not on file   ??? Intimate partner violence:     Fear of current or ex partner: Not on file     Emotionally abused: Not on file     Physically abused: Not on file     Forced sexual activity: Not on file   Other Topics Concern   ??? Not on file   Social History Narrative   ??? Not on file         ALLERGIES: Betadine [povidone-iodine]; Dexamethasone; Erythromycin; Hydrocodone; Mold extracts; Other plant, animal, environmental; Sulindac; and Tape [adhesive]    Review of Systems   Constitutional: Negative for activity change, chills and fever.   HENT: Negative for nosebleeds, sore throat, trouble swallowing and voice change.    Eyes: Negative for visual disturbance.   Respiratory: Negative for shortness of breath.    Cardiovascular: Negative for chest pain and palpitations.   Gastrointestinal: Negative for abdominal pain, bowel incontinence, constipation, diarrhea, nausea and vomiting.   Genitourinary: Negative for difficulty urinating, dysuria, hematuria and urgency.    Musculoskeletal: Negative for back pain, extremity weakness, neck pain and neck stiffness.   Skin: Negative for color change.   Allergic/Immunologic: Negative for immunocompromised state.   Neurological: Negative for dizziness, tingling, seizures, loss of consciousness, syncope, weakness, light-headedness, numbness and headaches.   Psychiatric/Behavioral: Negative for behavioral problems, confusion, hallucinations, self-injury and suicidal ideas.       Vitals:    02/01/18 2345   BP: 153/71   Pulse: 60   Resp: 17   Temp: 97.5 ??F (36.4 ??C)   SpO2: 100%            Physical Exam   Constitutional: She is oriented to person, place, and time. She appears well-developed and well-nourished. No distress.   HENT:   Head: Normocephalic and atraumatic.   Eyes: Pupils are equal, round, and reactive to light.   Neck: Normal range of motion. Neck supple.   Cardiovascular: Normal rate, regular rhythm and normal heart sounds. Exam reveals no gallop and no friction rub.   No murmur heard.  Pulmonary/Chest: Effort normal and breath sounds normal. No respiratory distress. She has no wheezes.   Abdominal: Soft. Bowel sounds are normal. She exhibits no distension. There is no tenderness. There is no rebound and no guarding.   Musculoskeletal: Normal range of motion.        Thoracic back: She exhibits bony tenderness. She exhibits no swelling, no edema and no deformity.   Neurological: She is alert and oriented to person, place, and time.   Skin: Skin is warm. Abrasion noted. No laceration and no rash noted. She is not diaphoretic.   Psychiatric: She has a normal mood and affect. Her behavior is normal. Judgment and thought content normal.   Nursing note and vitals reviewed.       MDM     This is a 72 year old female with past medical history, review of systems, physical exam as above, presenting with complaints of thoracic back pain, abrasions, after falling through the ceiling  at her home, landing on the floor.  Patient states she was  looking for something in the attic, when she missed a rafter, standing on sheet rock, falling through to the floor.  Patient states she landed on her right side, with her right arm underneath her head, without striking her head or loss of consciousness.  Patient endorses diffuse aches and pains, with focal midline thoracic back pain, without step-off or deformity.  She has abrasions over the posterior left lower leg, and right back, without other obvious injury.  She is noted to have clear breath sounds, soft abdomen, regular rate and rhythm without murmurs gallops or rubs, equal reactive pupils, no cervical or lumbar tenderness to palpation, free range of motion upper and lower extremities without crepitus, pain or deformity.  Suspect abrasions and contusion.  Plan to offer pain control, obtain CT imaging of the thoracic spine.  We will reevaluate, and make a disposition.      Procedures    12:51 AM  Spinous process fx on CT, no vertebral body or canal compromise, pain improved.  Will Dc home with Ortho follow up, OTC pain control, return precautions given.

## 2018-02-02 NOTE — ED Notes (Signed)
Discharge instructions given to pt by MD and RN . Pt has been given counseling on med use and verbalizes  understanding  . Pt ambulated off unit with no signs of distress.

## 2018-02-02 NOTE — ED Provider Notes (Signed)
The history is provided by the patient and the spouse. No language interpreter was used.   Fall   The accident occurred less than 1 hour ago. She fell from a height of 6 - 10 ft. She landed on carpet. There was no blood loss. The pain is at a severity of 6/10. The pain is moderate. She was ambulatory at the scene. There was no entrapment after the fall. There was no drug use involved in the accident. There was no alcohol use involved in the accident. Pertinent negatives include no visual change, no fever, no numbness, no abdominal pain, no bowel incontinence, no nausea, no vomiting, no hematuria, no headaches, no extremity weakness, no hearing loss, no loss of consciousness, no tingling and no laceration. The risk factors include being elderly.  The symptoms are aggravated by pressure on injury. She has tried nothing for the symptoms. The treatment provided no relief. It is unknown when the patient last had a tetanus shot.        Past Medical History:   Diagnosis Date   ??? Arthritis     osteroarthritis   ??? Cancer (HCC)     ? skin cancer   ??? Chronic pain     low back pain   ??? GERD (gastroesophageal reflux disease)    ??? Hyperlipidemia    ??? Hypertension    ??? Menopause 2004       Past Surgical History:   Procedure Laterality Date   ??? BREAST SURGERY PROCEDURE UNLISTED  2007    breast reduction   ??? HX BREAST BIOPSY Left 2000    benign   ??? HX BREAST REDUCTION Bilateral    ??? HX GYN  1980    left ovarian cyst removed   ??? HX HEENT  07/2013    face lift   ??? HX HEENT      tonsiloectomy & adnoidectomy   ??? HX ORTHOPAEDIC  2005    right rotator cuff repair   ??? HX OTHER SURGICAL  1987    cyst removed left arm   ??? IR INJ FORAMIN EPID LUMB ANES/STER SNGL  09/29/2017         Family History:   Problem Relation Age of Onset   ??? Heart Disease Mother    ??? Heart Disease Father    ??? Stroke Father    ??? Hypertension Father        Social History     Socioeconomic History   ??? Marital status: MARRIED     Spouse name: Not on file    ??? Number of children: Not on file   ??? Years of education: Not on file   ??? Highest education level: Not on file   Occupational History   ??? Not on file   Social Needs   ??? Financial resource strain: Not on file   ??? Food insecurity:     Worry: Not on file     Inability: Not on file   ??? Transportation needs:     Medical: Not on file     Non-medical: Not on file   Tobacco Use   ??? Smoking status: Never Smoker   ??? Smokeless tobacco: Never Used   Substance and Sexual Activity   ??? Alcohol use: Yes     Comment: very rarely   ??? Drug use: Never   ??? Sexual activity: Not Currently   Lifestyle   ??? Physical activity:     Days per week: Not on file  Minutes per session: Not on file   ??? Stress: Not on file   Relationships   ??? Social connections:     Talks on phone: Not on file     Gets together: Not on file     Attends religious service: Not on file     Active member of club or organization: Not on file     Attends meetings of clubs or organizations: Not on file     Relationship status: Not on file   ??? Intimate partner violence:     Fear of current or ex partner: Not on file     Emotionally abused: Not on file     Physically abused: Not on file     Forced sexual activity: Not on file   Other Topics Concern   ??? Not on file   Social History Narrative   ??? Not on file         ALLERGIES: Betadine [povidone-iodine]; Dexamethasone; Erythromycin; Hydrocodone; Mold extracts; Other plant, animal, environmental; Sulindac; and Tape [adhesive]    Review of Systems   Constitutional: Negative for activity change, chills and fever.   HENT: Negative for nosebleeds, sore throat, trouble swallowing and voice change.    Eyes: Negative for visual disturbance.   Respiratory: Negative for shortness of breath.    Cardiovascular: Negative for chest pain and palpitations.   Gastrointestinal: Negative for abdominal pain, bowel incontinence, constipation, diarrhea, nausea and vomiting.   Genitourinary: Negative for difficulty urinating, dysuria, hematuria and  urgency.   Musculoskeletal: Negative for back pain, extremity weakness, neck pain and neck stiffness.   Skin: Negative for color change.   Allergic/Immunologic: Negative for immunocompromised state.   Neurological: Negative for dizziness, tingling, seizures, loss of consciousness, syncope, weakness, light-headedness, numbness and headaches.   Psychiatric/Behavioral: Negative for behavioral problems, confusion, hallucinations, self-injury and suicidal ideas.       Vitals:    02/01/18 2345   BP: 153/71   Pulse: 60   Resp: 17   Temp: 97.5 ??F (36.4 ??C)   SpO2: 100%            Physical Exam   Constitutional: She is oriented to person, place, and time. She appears well-developed and well-nourished. No distress.   HENT:   Head: Normocephalic and atraumatic.   Eyes: Pupils are equal, round, and reactive to light.   Neck: Normal range of motion. Neck supple.   Cardiovascular: Normal rate, regular rhythm and normal heart sounds. Exam reveals no gallop and no friction rub.   No murmur heard.  Pulmonary/Chest: Effort normal and breath sounds normal. No respiratory distress. She has no wheezes.   Abdominal: Soft. Bowel sounds are normal. She exhibits no distension. There is no tenderness. There is no rebound and no guarding.   Musculoskeletal: Normal range of motion.        Thoracic back: She exhibits bony tenderness. She exhibits no swelling, no edema and no deformity.   Neurological: She is alert and oriented to person, place, and time.   Skin: Skin is warm. Abrasion noted. No laceration and no rash noted. She is not diaphoretic.   Psychiatric: She has a normal mood and affect. Her behavior is normal. Judgment and thought content normal.   Nursing note and vitals reviewed.       MDM     This is a 72 year old female with past medical history, review of systems, physical exam as above, presenting with complaints of thoracic back pain, abrasions, after falling through the ceiling  at her home, landing on the  floor.  Patient states she was looking for something in the attic, when she missed a rafter, standing on sheet rock, falling through to the floor.  Patient states she landed on her right side, with her right arm underneath her head, without striking her head or loss of consciousness.  Patient endorses diffuse aches and pains, with focal midline thoracic back pain, without step-off or deformity.  She has abrasions over the posterior left lower leg, and right back, without other obvious injury.  She is noted to have clear breath sounds, soft abdomen, regular rate and rhythm without murmurs gallops or rubs, equal reactive pupils, no cervical or lumbar tenderness to palpation, free range of motion upper and lower extremities without crepitus, pain or deformity.  Suspect abrasions and contusion.  Plan to offer pain control, obtain CT imaging of the thoracic spine.  We will reevaluate, and make a disposition.      Procedures    12:51 AM  Spinous process fx on CT, no vertebral body or canal compromise, pain improved.  Will Dc home with Ortho follow up, OTC pain control, return precautions given.

## 2018-02-02 NOTE — Progress Notes (Signed)
ED Discharge Follow-Up    Date/Time:     02/02/2018 11:52 AM    Patient presented to [name]  ED on 02/01/18 and was diagnosed with thoracic fracture.       Top Challenges reviewed with the provider   - patient with T 8-9 fracture per ortho - no intervention needed will heal on its own              Method of communication with provider :chart routing    Nurse Navigator(NN) contacted the  patient  by telephone to perform post ED discharge assessment. Verified name and DOB with patient as identifiers. Provided introduction to self, and explanation of the Nurse Navigator role.      Patient reported assessment: Patient reports was up in her attic last night around 11pm when she stepped over a rafter and fell through the ceiling. She proceeded to the ER . CT showed spinous process fx. She followed up this AM with ortho Dr Lodema HongSimpson and more xrays were done and showed fractures at T8-9 . She was told there was no intervention/ surgery needed and she should return to regular activities/ exercise and these would heal on their own. Tylenol is controlling the pain. Patient also notes was recently tx'd for abscess on tailbone area, she is on her last few days of antibiotics and this is healing up nicely. Patient has no further concerns or question.       Medication(s):   New Medications at Discharge: none  Changed Medications at Discharge: none  Discontinued Medications at Discharge: none    There were no barriers to obtaining medications identified at this time.    Reviewed discharge instructions and red flags with  patient who voiced understanding. Patient given an opportunity to ask questions and does not have any further questions or concerns at this time. The patient agrees to contact the PCP office for questions related to their healthcare.     Patient reminded that there are physicians on call 24 hours a day / 7 days a week (M-F 5pm to 8am and from Friday 5pm until Monday 8am for the  weekend) should the patient have questions or concerns. NN provided contact information for future reference.      Offered follow up appointment with PCP: no   [facility] follow up appointment(s): No future appointments.   Non-[facility] follow up appointment(s): patient saw ortho Dr Lodema HongSimpson this am  [facility]:  n/a    [email or web address] 9 AM- 9 PM.   Phone [phone]

## 2018-04-02 ENCOUNTER — Encounter

## 2018-04-02 MED ORDER — LOSARTAN-HYDROCHLOROTHIAZIDE 50 MG-12.5 MG TAB
ORAL_TABLET | Freq: Every day | ORAL | 0 refills | Status: AC
Start: 2018-04-02 — End: 2018-07-01

## 2018-04-02 MED ORDER — LOSARTAN-HYDROCHLOROTHIAZIDE 50 MG-12.5 MG TAB
ORAL_TABLET | Freq: Every day | ORAL | 0 refills | Status: DC
Start: 2018-04-02 — End: 2018-06-29

## 2018-04-02 NOTE — Telephone Encounter (Signed)
LOV: 12/29/2017  Refill: 12/29/2017  Labs: 12/11/2017  Next Appt: TBD

## 2018-04-06 NOTE — Telephone Encounter (Signed)
From: Carie Caddy  To: Vernia Buff, MD  Sent: 04/06/2018 11:35 AM EDT  Subject: Prescription Question    It is time to renew my Losartan-HCTZ 50-12.5mg  prescription. Should I get a refill or need to re-evaluate.    I was fine (BP 126/86) until I fell through the attic floor on 02/01/18. On 03/24/18 my Orthopaedist's PA prescribed Diclofenac Sod EC 50 mg and Tramadol HCL 50 mg for pain. I tried the NSAID first to see if it relieved my pain. It didn't work so I started taking 1-2 Tramadol a day. I was woozie and couldn't function, resting and sleeping a lot, and had gut problems. 10/4 pain returned, BP 171/126 (when I finally checked it) and stopped Tramadol. 10/4-5 no meds, 10/6 back on NSAID 11pm. 10/6 BP 141/98 and 127/92 both taken around 5pm. Today (10/7) BP 146/104 and 131/93.     Please advise.  Carie Caddy

## 2018-04-07 NOTE — Telephone Encounter (Signed)
patient has question about losartan please follow up with patient [phone]

## 2018-04-07 NOTE — Telephone Encounter (Signed)
Patient was concerned her BP med wasn't called in, Informed patient that BP med was called in on 04/02/18 and it should be at the pharmacy. Patient verbalized her understanding and stated she will call us back if she has any issues. Patient was confused about her mychart message. I let her know per Dr. Magnus IvanHabib to stop taking NSAID and Tramadol and try taking tylenol arthritis and get a lidocaine patch because that should help her pain and to record her BP readings, and let us know what it is. Patient again verbalized her understanding and thanked me for the call.

## 2018-04-23 MED ORDER — SIMVASTATIN 20 MG TAB
20 mg | ORAL_TABLET | Freq: Every evening | ORAL | 0 refills | Status: DC
Start: 2018-04-23 — End: 2018-07-19

## 2018-04-23 NOTE — Telephone Encounter (Signed)
-----   Message from Phillis Haggis sent at 04/23/2018 10:31 AM EDT -----  Regarding: Dr.Habib/Refill   Medication Refill    Caller (if not patient):      Relationship of caller (if not patient):      Best contact number(s): [phone]      Name of medication and dosage if known: Simvastatin 20 mg       Is patient out of this medication (yes/no): no       Pharmacy name: [facility]     Pharmacy listed in chart? (yes/no): yes  Pharmacy phone number: on file       Details to clarify the request:      Phillis Haggis

## 2018-04-23 NOTE — Telephone Encounter (Signed)
LOV: 01/22/2018  Labs:12/11/2017  Refill: 1st Refill   Next Appt:TBD

## 2018-05-18 NOTE — Telephone Encounter (Signed)
Patient has some questions on medications she is taking, would like to speak to a nurse

## 2018-05-19 NOTE — Telephone Encounter (Signed)
Attempted to return patient's call, no answer and no voice mail. End of encounter.

## 2018-05-19 NOTE — Telephone Encounter (Signed)
Pt called back. Would like to get a call back from the nursing staff.

## 2018-05-19 NOTE — Telephone Encounter (Signed)
She should be fine. Her potassium was on low normal range on last blood work.

## 2018-05-19 NOTE — Telephone Encounter (Signed)
Patient called and she states that she is concerned because she is taking losartan-hydroCHLOROthiazide (HYZAAR) 50-12.5 mg per tablet and a potassium , and patient is concerned she is taking too much potassium because the pharmacy told her losartan has potassium in it also, she wants to know if she is ok to take both. End of encounter.

## 2018-05-19 NOTE — Telephone Encounter (Signed)
Called and spoke with patient and advised that Dr. Magnus IvanHabib states ok to take Losartan and potassium at this time. Patient had no other questions or concerns. End of encounter.

## 2018-06-29 MED ORDER — LOSARTAN-HYDROCHLOROTHIAZIDE 50 MG-12.5 MG TAB
ORAL_TABLET | ORAL | 0 refills | Status: DC
Start: 2018-06-29 — End: 2018-06-30

## 2018-06-30 ENCOUNTER — Encounter

## 2018-06-30 MED ORDER — LOSARTAN-HYDROCHLOROTHIAZIDE 50 MG-12.5 MG TAB
ORAL_TABLET | Freq: Every day | ORAL | 0 refills | Status: DC
Start: 2018-06-30 — End: 2018-07-20

## 2018-07-19 MED ORDER — SIMVASTATIN 20 MG TAB
20 mg | ORAL_TABLET | ORAL | 0 refills | Status: DC
Start: 2018-07-19 — End: 2018-07-20

## 2018-07-20 ENCOUNTER — Encounter

## 2018-07-20 NOTE — Telephone Encounter (Signed)
From: Carie Caddy  To: Vernia Buff, MD  Sent: 07/19/2018 10:04 PM EST  Subject: Prescription Question    Thank you for refilling my Simvistatin RX but it was ordered at the wrong pharmacy. I've notified your office that I now use [facility] on [address].

## 2018-07-21 MED ORDER — SIMVASTATIN 20 MG TAB
20 mg | ORAL_TABLET | Freq: Every evening | ORAL | 0 refills | Status: AC
Start: 2018-07-21 — End: 2018-10-18

## 2018-07-21 MED ORDER — LOSARTAN-HYDROCHLOROTHIAZIDE 50 MG-12.5 MG TAB
ORAL_TABLET | Freq: Every day | ORAL | 0 refills | Status: AC
Start: 2018-07-21 — End: 2018-10-18

## 2018-07-24 NOTE — Telephone Encounter (Signed)
Called and advised patient that refill for Simvastatin 90 D supply was sent to [facility] pharmacy today. Encouraged to call with questions. End of encounter.

## 2018-07-24 NOTE — Telephone Encounter (Signed)
-----   Message from Frances Furbish sent at 07/24/2018 10:21 AM EST -----  Regarding: Dr. Kandis Nab: [phone]  Reason for call: Medication                           Callback required yes/no and why: Pt is wanting to follow up on her request to have her simvastatin prescription sent to the correct [facility] pharmacy.  She has talked to [facility] and they have not gotten any medication request for her, please resend the refill for pt as soon as possible and call her once that is done  Best contact number(s): [phone]

## 2018-09-08 ENCOUNTER — Encounter

## 2018-09-08 ENCOUNTER — Ambulatory Visit: Admit: 2018-09-08 | Discharge: 2018-09-08 | Payer: MEDICARE | Attending: Internal Medicine | Primary: Internal Medicine

## 2018-09-08 ENCOUNTER — Ambulatory Visit: Attending: Internal Medicine | Primary: Internal Medicine

## 2018-09-08 DIAGNOSIS — I10 Essential (primary) hypertension: Secondary | ICD-10-CM

## 2018-09-08 MED ORDER — ALPRAZOLAM 0.25 MG TAB
0.25 mg | ORAL_TABLET | Freq: Every evening | ORAL | 0 refills | Status: AC | PRN
Start: 2018-09-08 — End: ?

## 2018-09-08 NOTE — Progress Notes (Signed)
Chief Complaint   Patient presents with   . Blood Pressure Check     1. Have you been to the ER, urgent care clinic since your last visit?  Hospitalized since your last visit?no    2. Have you seen or consulted any other health care providers outside of the [facility] since your last visit?  Include any pap smears or colon screening. No    Health Maintenance Due   Topic Date Due   . DTaP/Tdap/Td series (1 - Tdap) 08/01/1957   . Shingrix Vaccine Age 74> (1 of 2) 08/01/1996   . FOBT Q1Y Age 30-75  08/01/1996
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
## 2018-09-08 NOTE — Progress Notes (Signed)
Chief Complaint   Patient presents with   ??? Blood Pressure Check     1. Have you been to the ER, urgent care clinic since your last visit?  Hospitalized since your last visit?no    2. Have you seen or consulted any other health care providers outside of the [facility] since your last visit?  Include any pap smears or colon screening. No    Health Maintenance Due   Topic Date Due   ??? DTaP/Tdap/Td series (1 - Tdap) 08/01/1957   ??? Shingrix Vaccine Age 50> (1 of 2) 08/01/1996   ??? FOBT Q1Y Age 50-75  08/01/1996
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
## 2018-09-24 ENCOUNTER — Ambulatory Visit: Payer: MEDICARE | Primary: Internal Medicine

## 2018-10-09 ENCOUNTER — Encounter

## 2018-10-09 NOTE — Telephone Encounter (Signed)
Patient has moved to [place].

## 2018-10-09 NOTE — Telephone Encounter (Signed)
Last visit: 09/08/18  Last refill: 07/20/18  Last lab: 12/10/17

## 2018-10-13 ENCOUNTER — Encounter

## 2018-10-13 NOTE — Telephone Encounter (Signed)
Patient moved to [place].

## 2018-10-13 NOTE — Telephone Encounter (Signed)
Last visit: 09/08/18  Last refill: 07/20/18

## 2021-12-15 IMAGING — MR MRI RIGHT SHOULDER WITHOUT CONTRAST
5 of 7 series · 23 of 40 positions shown · IV contrast (gadolinium)
Comparison: None

________________________________________________________________________________________________ 
MRI RIGHT SHOULDER WITHOUT CONTRAST, 12/15/2021 [DATE]: 
CLINICAL INDICATION: Painful range of motion. History of rotator cuff repair in 
6223. Onset of pain for 2 months. No trauma..
TECHNIQUE: Multiplanar, multiecho position MR images of the shoulder were 
performed without intravenous gadolinium enhancement. Patient was scanned on a 
1.5T magnet.

[Series 101: survey_fullfov_transversal · axial · 10.0mm · 1.84mm/px · z∈[-51,+51]mm · 2 of 7 slices shown]
[im 1/7]
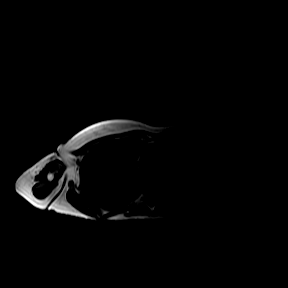
[im 7/7]
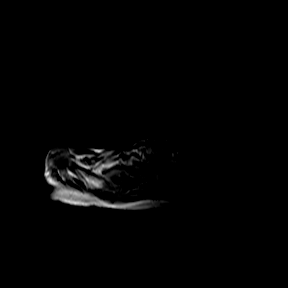

[Series 201: survey mst · axial · 10.0mm · 0.99mm/px · z∈[-40,+175]mm · 4 of 15 slices shown]
[im 1/15]
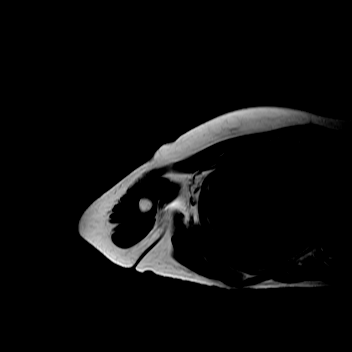
[im 5/15]
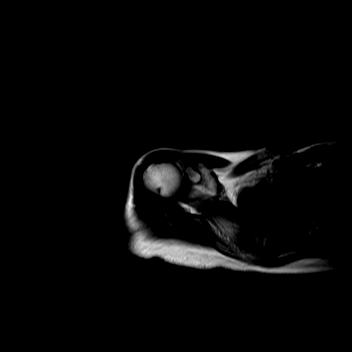
[im 10/15]
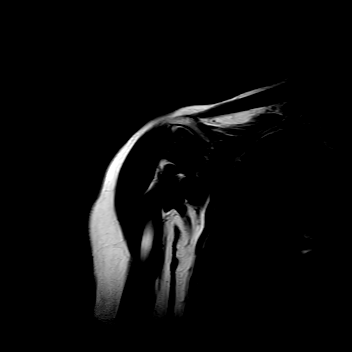
[im 15/15]
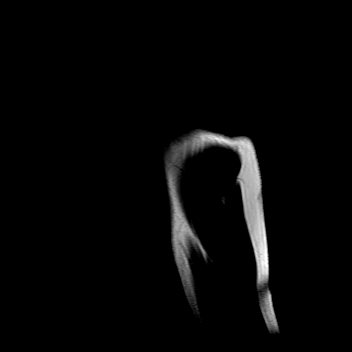

[Series 301: (person_name)_(person_name)_(person_name)* · axial · 3.0mm · 0.35mm/px · z∈[-21,+68]mm · 8 of 28 slices shown]
[im 1/28]
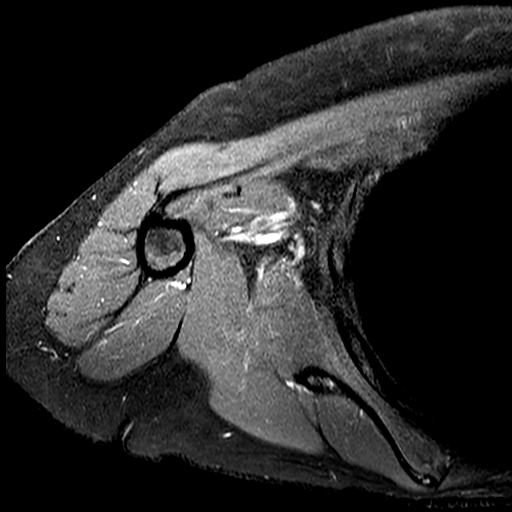
[im 4/28]
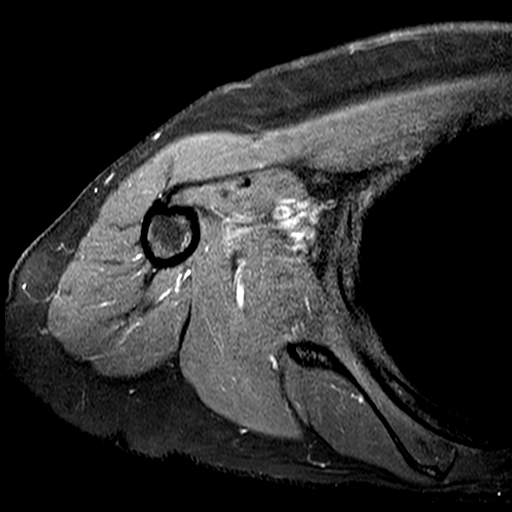
[im 8/28]
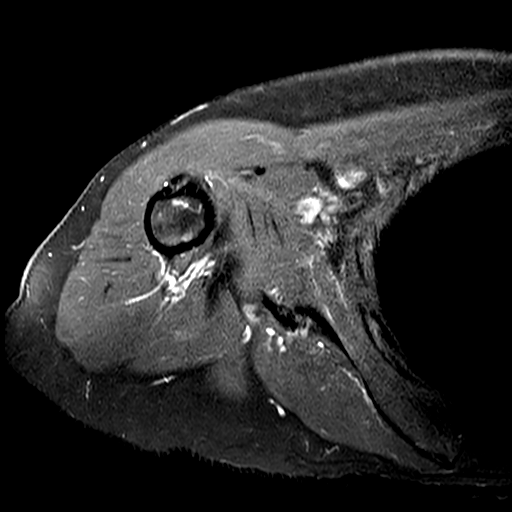
[im 12/28]
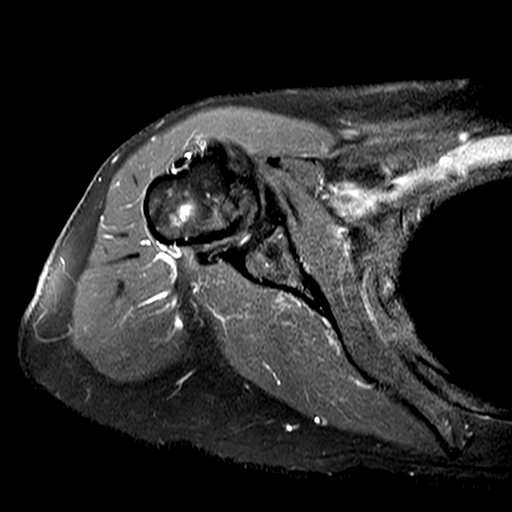
[im 16/28]
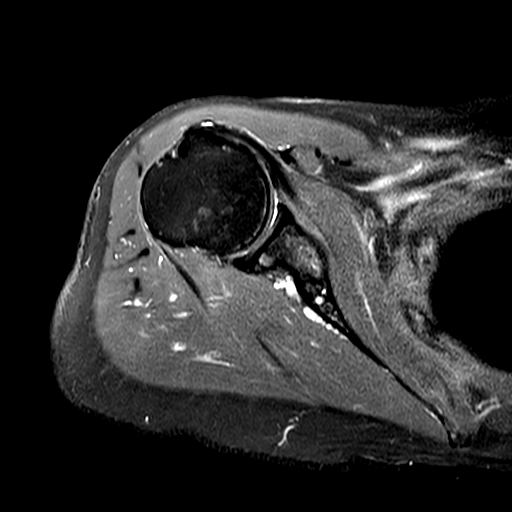
[im 20/28]
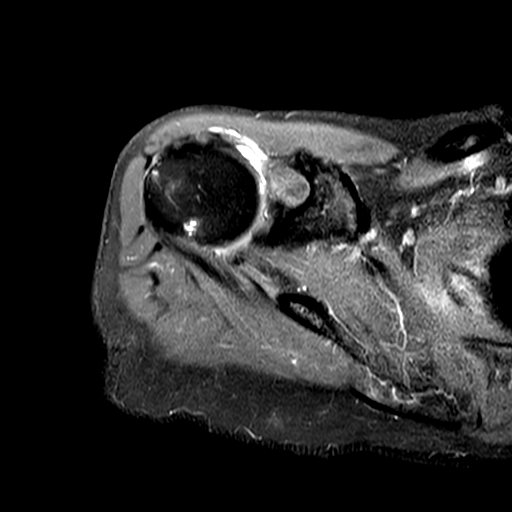
[im 24/28]
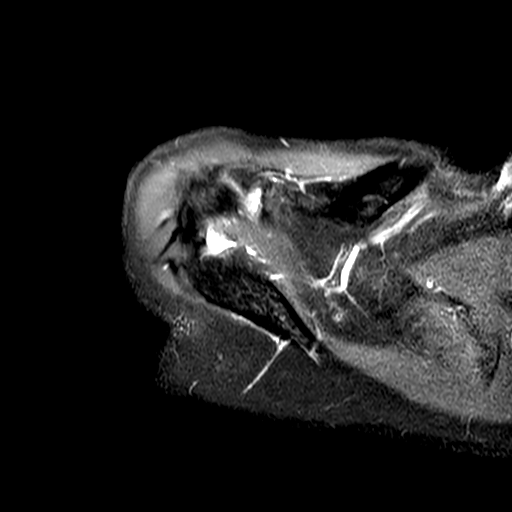
[im 28/28]
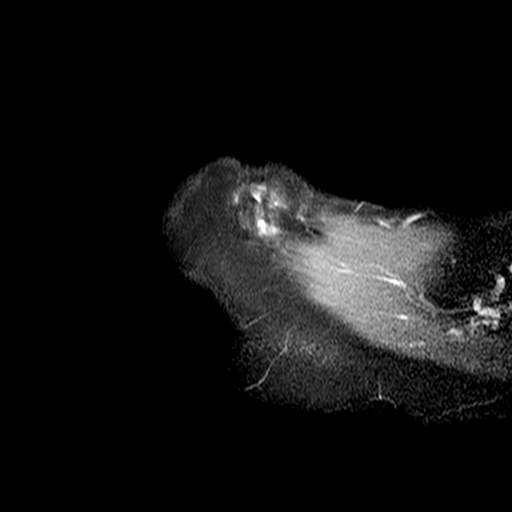

[Series 401: t2_fs_sag* · oblique · 3.0mm · 0.37mm/px · 7 of 26 slices shown]
[im 1/26]
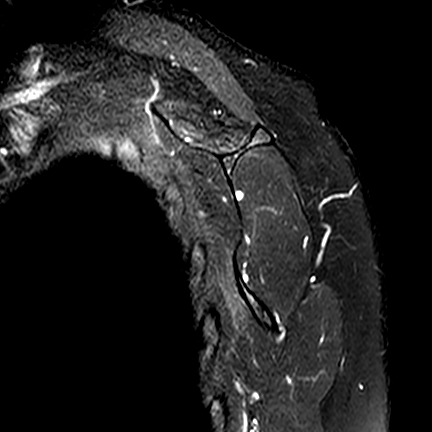
[im 5/26]
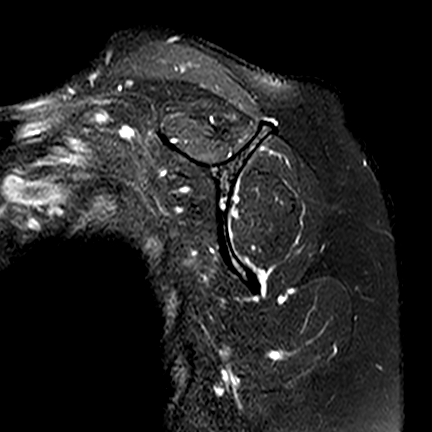
[im 9/26]
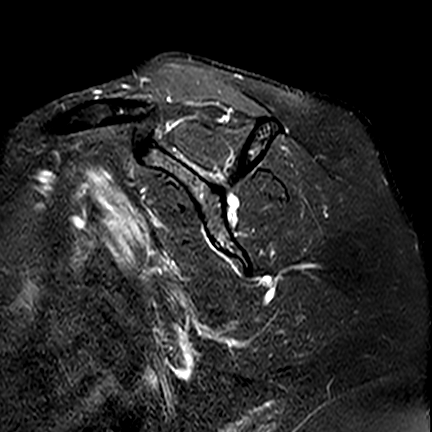
[im 13/26]
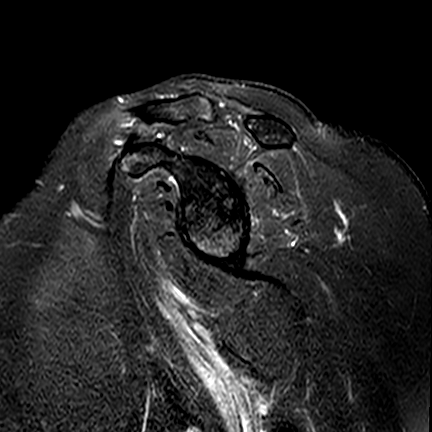
[im 17/26]
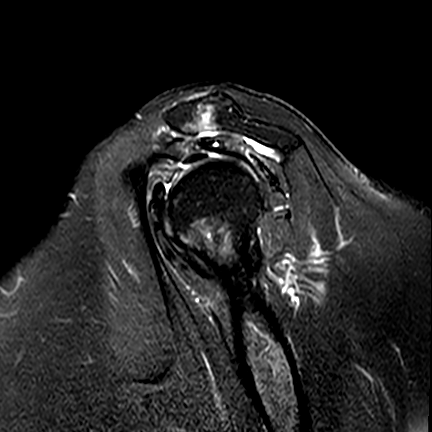
[im 21/26]
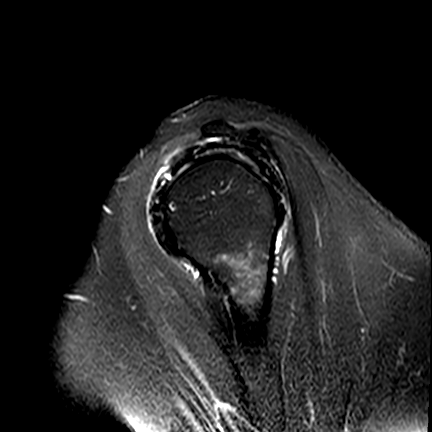
[im 26/26]
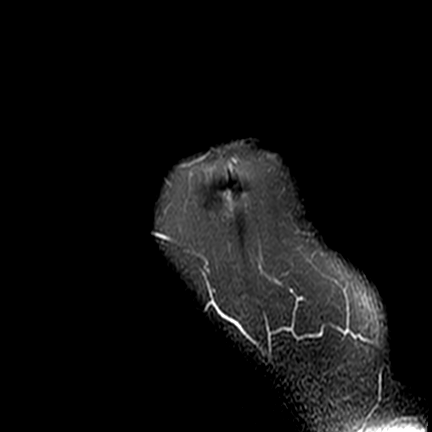

[Series 501: t1_sag* · oblique · 3.0mm · 0.29mm/px · 2 of 26 slices shown]
[im 1/26]
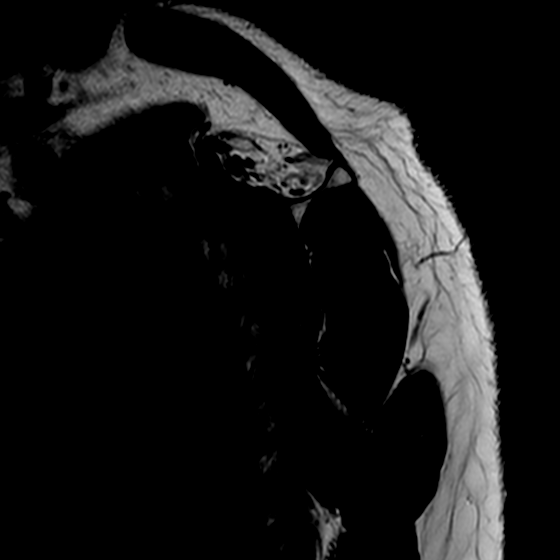
[im 5/26]
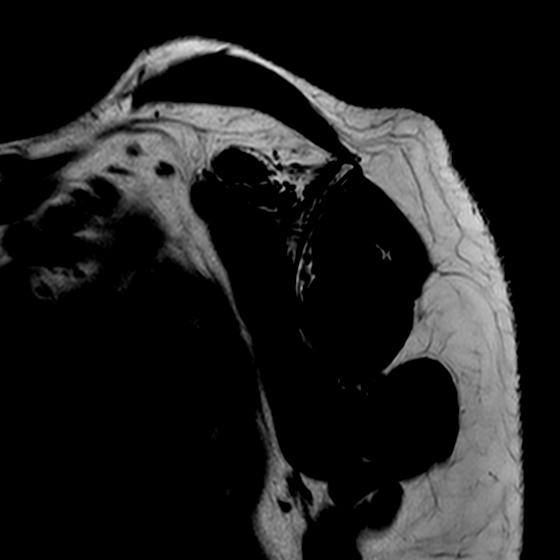

[23 of 40 positions shown; findings below may reference images not displayed]

FINDINGS: ROTATOR CUFF: Postsurgical changes of prior rotator cuff repair with trace 
metallic susceptibility artifact about the rotator cuff. Mild signal abnormality 
with mild tendinosis of the aponeurotic juncture of the supraspinatus and 
infraspinatus tendons. Trace intrasubstance delamination within the 
supraspinatus. The supraspinatus, infraspinatus, subscapularis and teres minor 
tendons are otherwise intact. No high-grade partial or full-thickness rotator 
cuff tear. There is asymmetric advanced atrophy/fatty infiltration of the 
supraspinatus muscle, image 1 of series 501. 
ACROMIOCLAVICULAR JOINT: Minimal hypertrophic changes of the acromioclavicular 
joint without mass effect upon the supraspinatus. The coracoacromial ligament is 
intact without prominent spurring at the acromial attachment. The 
acromioclavicular and coracoclavicular ligaments are preserved. Trace fluid 
within the subacromial-subdeltoid space without overt bursitis. 
GLENOHUMERAL JOINT: The humeral head is well located within the glenoid fossa. 
Articular cartilage is preserved.  The glenoid labrum is preserved. No 
paralabral cyst. The intra-articular portion of the long head of the biceps 
tendon is negative. Small shoulder joint effusion. 
BONES: The bone marrow signal intensity is negative for fracture. No Hill-Sachs 
defect. Subcortical cystic change of the humeral head. 
ADDITIONAL FINDINGS: The axillary region is negative. Subcutaneous tissues are 
negative.
IMPRESSION: 1.  Postsurgical changes of prior rotator cuff repair. 
2.  Mild tendinosis of the supraspinatus and infraspinatus tendons with trace 
intrasubstance delamination within the supraspinatus. 
3.  Asymmetric advanced atrophy of the supraspinatus muscle.

## 2023-09-12 IMAGING — MR MRI LEFT SHOULDER WITHOUT CONTRAST
5 of 6 series · 29 of 40 positions shown · IV contrast (gadolinium)
Comparison: 09/01/2023 [facility] radiographs

________________________________________________________________________________________________ 
MRI LEFT SHOULDER WITHOUT CONTRAST, 09/12/2023 [DATE]: 
CLINICAL INDICATION: Pain in left shoulder
TECHNIQUE: Multiplanar, multiecho position MR images of the shoulder were 
performed without intravenous gadolinium enhancement.

[Series 201: survey left · axial · left · 10.0mm · 0.71mm/px · z∈[-40,+125]mm · 5 of 15 slices shown]
[im 1/15]
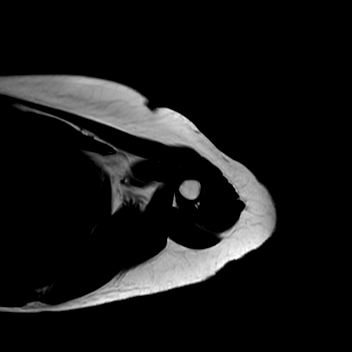
[im 4/15]
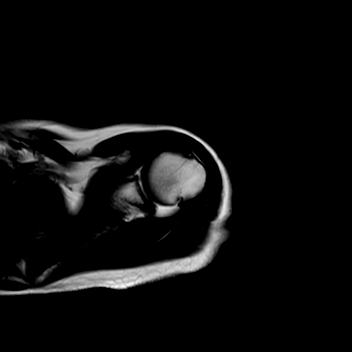
[im 8/15]
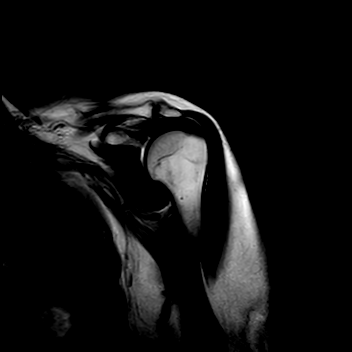
[im 11/15]
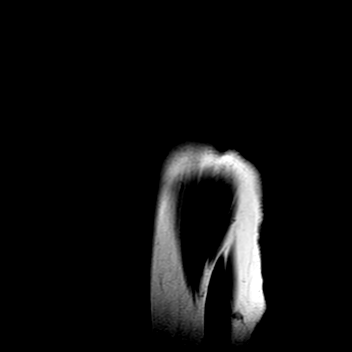
[im 15/15]
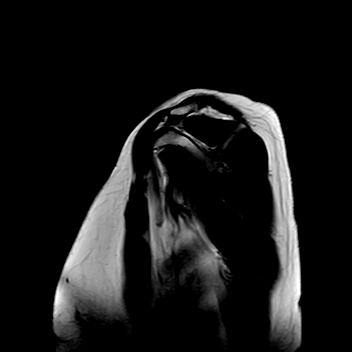

[Series 301: (person_name)_(person_name)_(person_name) · axial · left · 3.5mm · 0.42mm/px · z∈[-57,+50]mm · 9 of 28 slices shown]
[im 1/28]
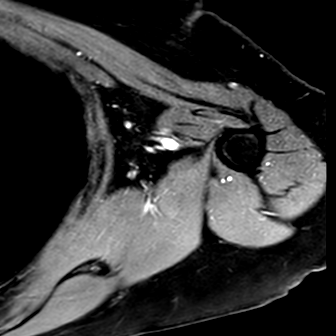
[im 4/28]
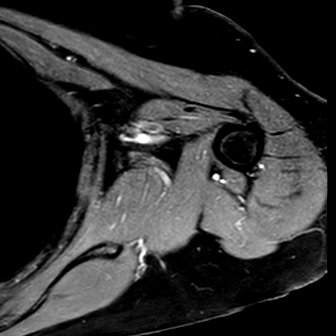
[im 7/28]
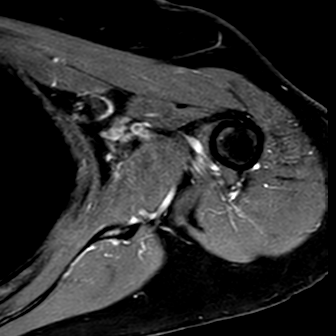
[im 11/28]
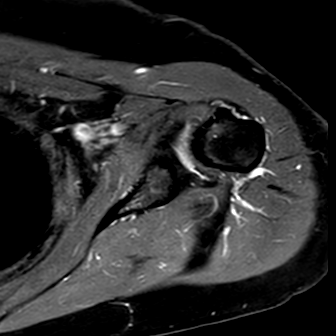
[im 14/28]
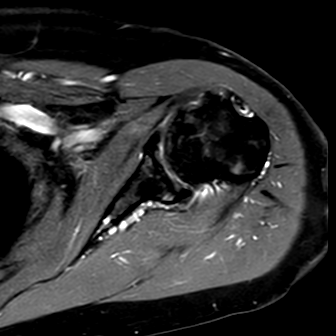
[im 17/28]
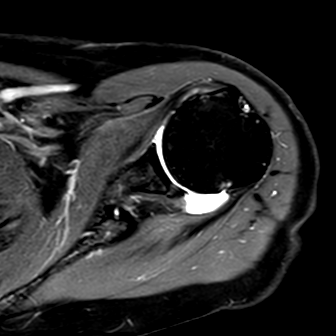
[im 21/28]
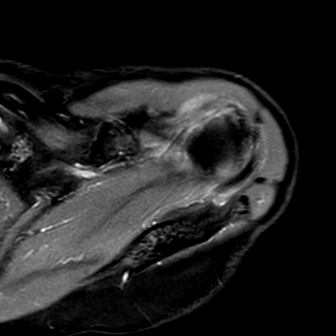
[im 24/28]
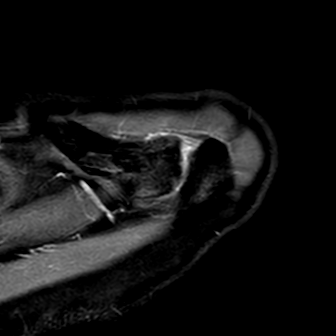
[im 28/28]
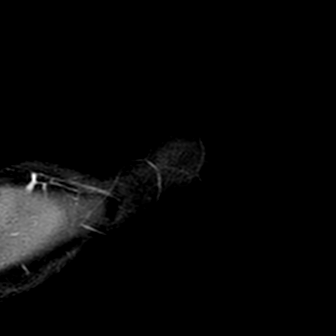

[Series 401: t2_fs_sag left · oblique · left · 3.0mm · 0.29mm/px · 7 of 23 slices shown]
[im 1/23]
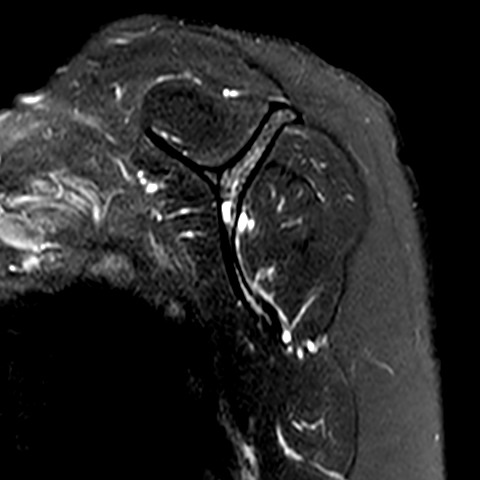
[im 4/23]
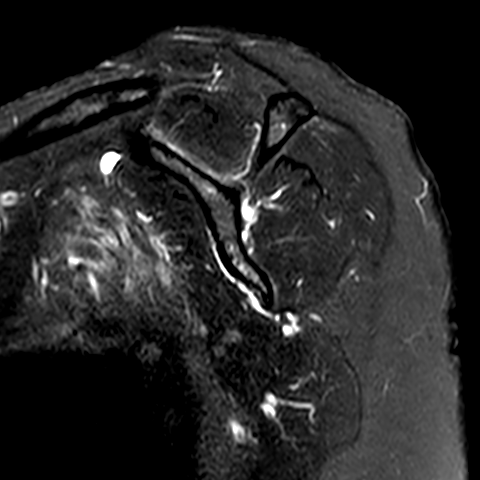
[im 8/23]
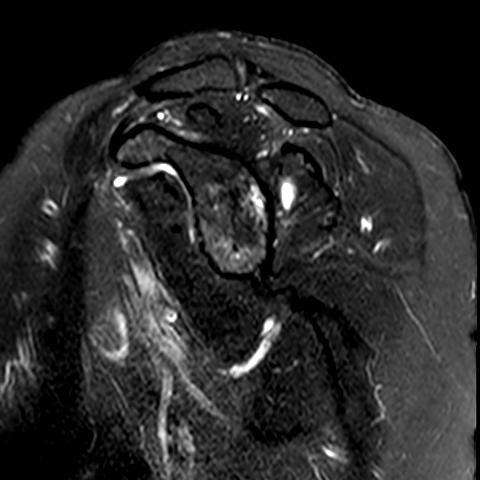
[im 12/23]
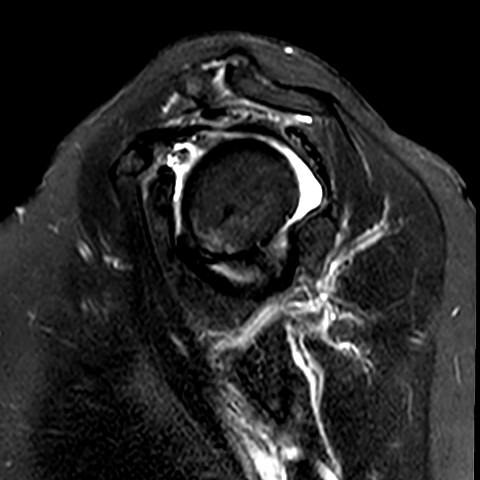
[im 15/23]
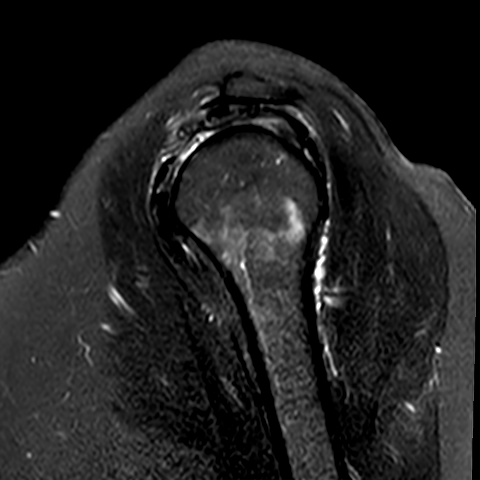
[im 19/23]
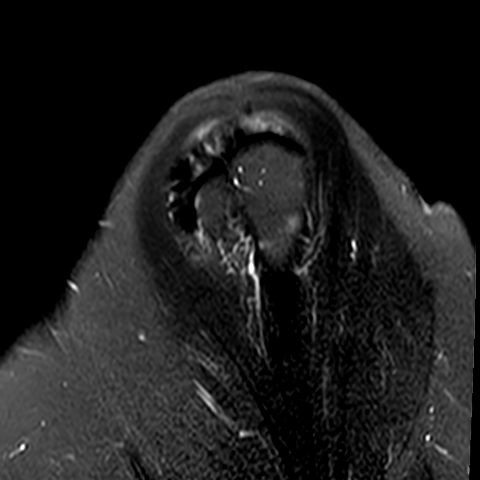
[im 23/23]
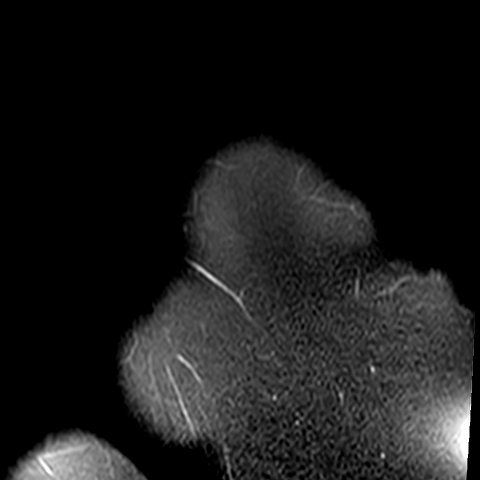

[Series 501: t1_sag left · oblique · left · 3.0mm · 0.31mm/px · 7 of 23 slices shown]
[im 1/23]
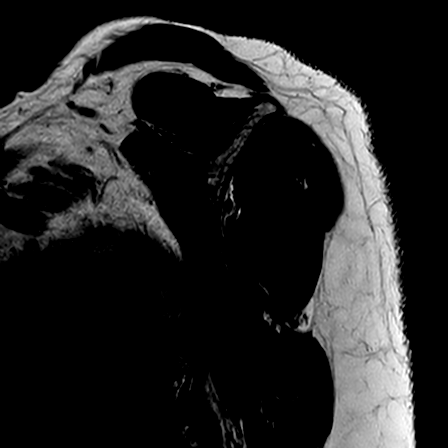
[im 4/23]
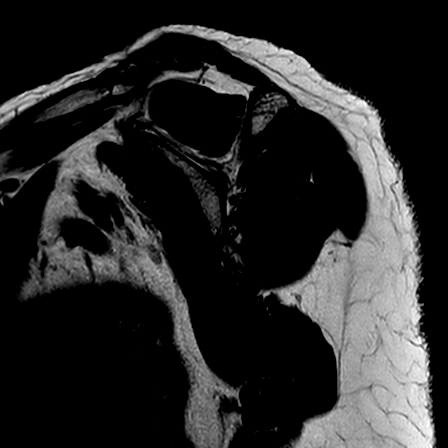
[im 8/23]
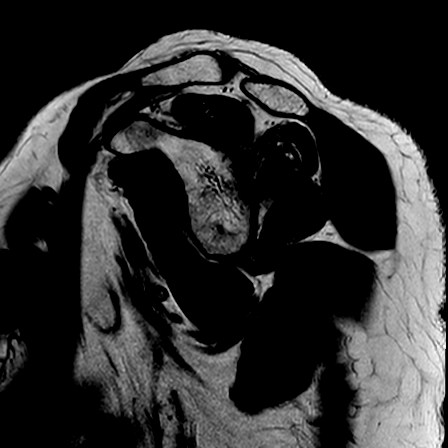
[im 12/23]
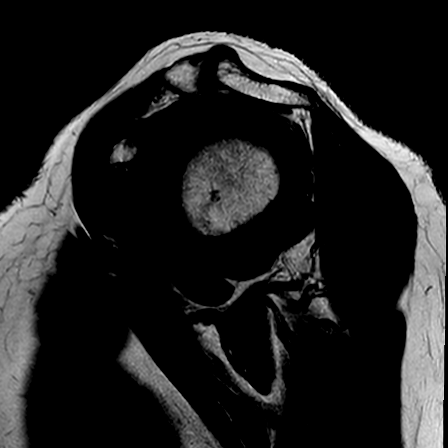
[im 15/23]
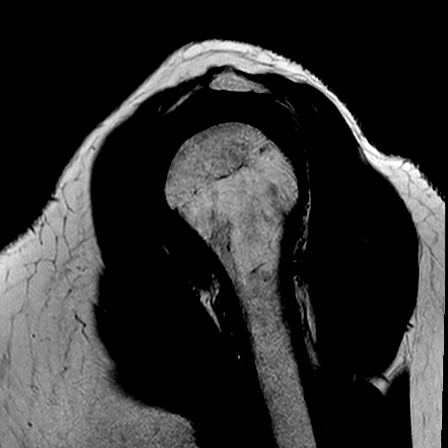
[im 19/23]
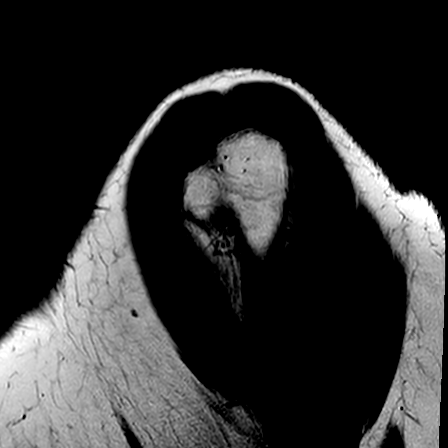
[im 23/23]
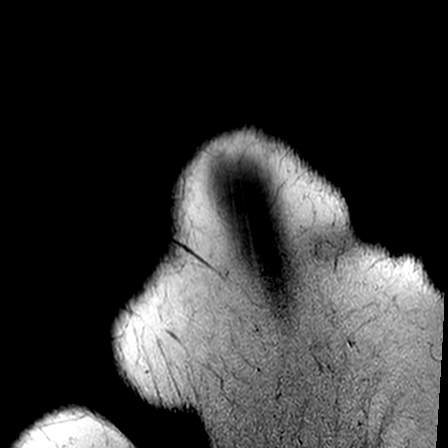

[Series 601: pd_fs_cor left · oblique · left · 3.5mm · 0.36mm/px · 1 of 18 slices shown]
[im 1/18]
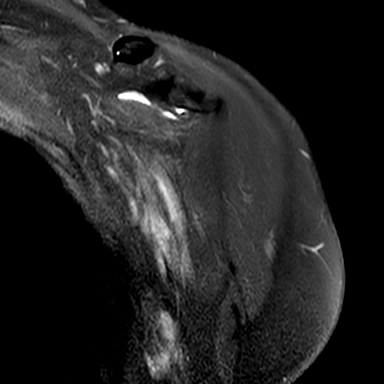

[29 of 40 positions shown; findings below may reference images not displayed]

FINDINGS: ROTATOR CUFF: Several and small low-grade articular sided and concealed 
intrasubstance distal supraspinatus tendon tears measure up to 0.7 cm in AP 
dimensions. 0.1 cm low-grade articular sided distal subscapularis tendon tear. 
The infraspinatus and teres minor tendons are intact. The rotator cuff 
musculature is symmetric without mass, signal abnormality or atrophy. 
ACROMIOCLAVICULAR JOINT: Mild degenerative change of the acromioclavicular joint 
without mass effect upon the supraspinatus. The coracoacromial ligament is 
intact without prominent spurring at the acromial attachment. The 
acromioclavicular and coracoclavicular ligaments are preserved. The acromium is 
normal in morphology. 
GLENOHUMERAL JOINT: The humeral head is well located within the glenoid fossa. 
Mild degenerative change with partial-thickness chondromalacia, small medial 
humeral neck osteophytes, small posterior glenoid and humeral head subcortical 
cysts.  The glenoid labrum is preserved. No paralabral cyst. Thinning of the 
long head of the biceps tendon. Moderate glenohumeral joint effusion and mild 
synovitis. 
BONES: The bone marrow signal intensity is negative for fracture. No Hill-Sachs 
defect. Subcortical cystic change of the humeral head. 
ADDITIONAL FINDINGS: The axillary region is negative. Subcutaneous tissues are 
negative.
IMPRESSION: 1.  Several and small low-grade articular sided and concealed intrasubstance 
distal supraspinatus tendon tears measure up to 0.7 cm in AP dimensions.  
2.  0.1 cm low-grade articular sided distal subscapularis tendon tear.  
3.  Thinning of the long head of the biceps tendon.  
4.  Mild glenohumeral joint degenerative change, moderate joint effusion and 
mild synovitis. 
5.  Mild AC joint degenerative change without mass effect upon the 
supraspinatus.
